# Patient Record
Sex: Male | Born: 1970 | Race: White | Hispanic: No | Marital: Married | Smoking: Former smoker
Health system: Southern US, Community
[De-identification: ages and names within clinical notes are randomized; demographics above are authoritative.]

## PROBLEM LIST (undated history)

## (undated) DIAGNOSIS — I10 Essential (primary) hypertension: Secondary | ICD-10-CM

## (undated) HISTORY — DX: Essential (primary) hypertension: I10

## (undated) HISTORY — PX: ELBOW ARTHROPLASTY: SHX928

---

## 2006-09-19 ENCOUNTER — Ambulatory Visit: Payer: Self-pay

## 2006-10-10 ENCOUNTER — Other Ambulatory Visit: Payer: Self-pay

## 2006-10-10 ENCOUNTER — Emergency Department: Payer: Self-pay | Admitting: Emergency Medicine

## 2006-10-17 ENCOUNTER — Ambulatory Visit: Payer: Self-pay | Admitting: Internal Medicine

## 2006-10-22 ENCOUNTER — Ambulatory Visit: Payer: Self-pay | Admitting: Anesthesiology

## 2006-10-27 ENCOUNTER — Ambulatory Visit: Payer: Self-pay | Admitting: Anesthesiology

## 2007-08-06 IMAGING — CT CT HEAD WITHOUT CONTRAST
2 series · 16 of 30 positions shown, 20 images · non-contrast
Comparison: none

REASON FOR EXAM: Occipital HA
COMMENTS:

[Series 2: without · axial · non-contrast · 0.44mm/px · z∈[-160,-35]mm · 13 of 31 slices shown, 17 images]
[im 3/31  brain]
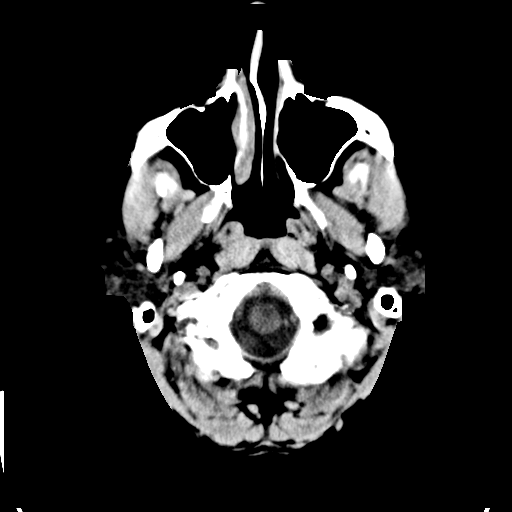
[im 3/31  bone]
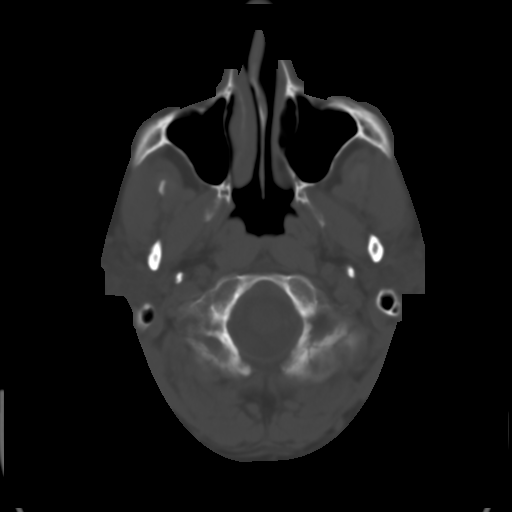
[im 5/31  brain]
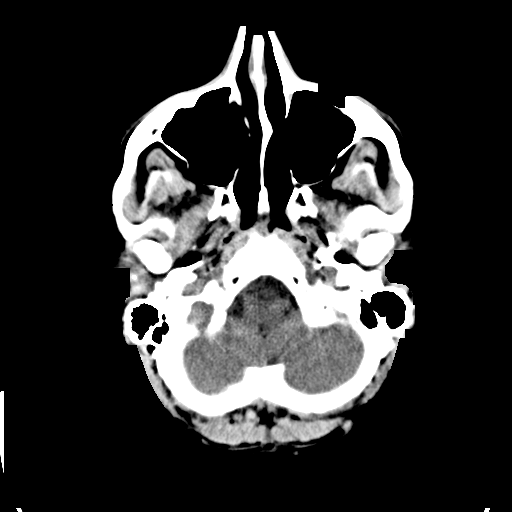
[im 7/31  brain]
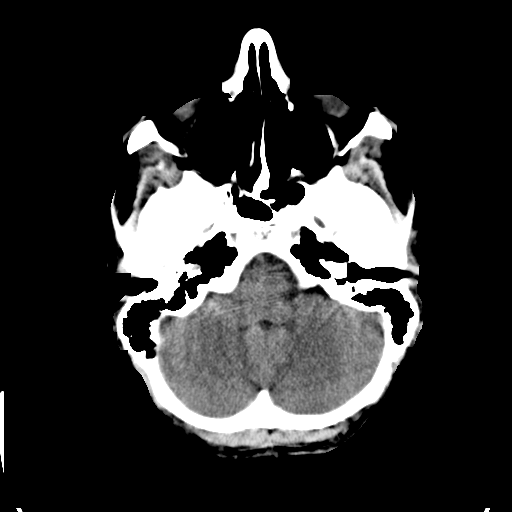
[im 9/31  brain]
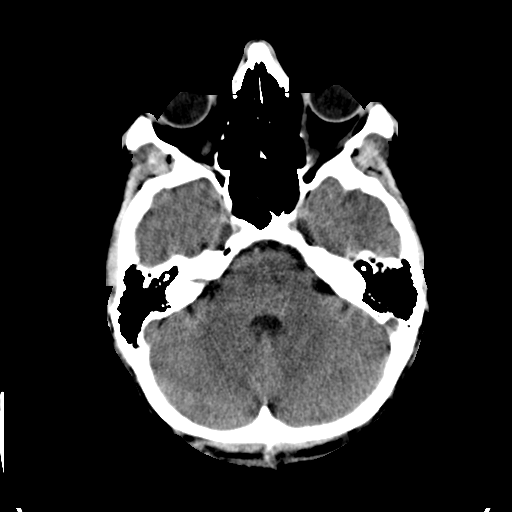
[im 11/31  brain]
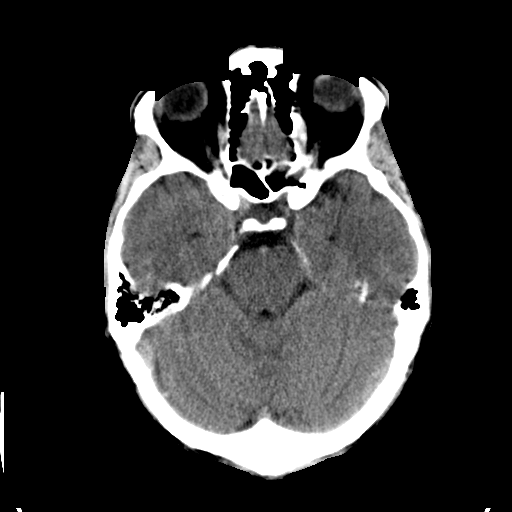
[im 11/31  bone]
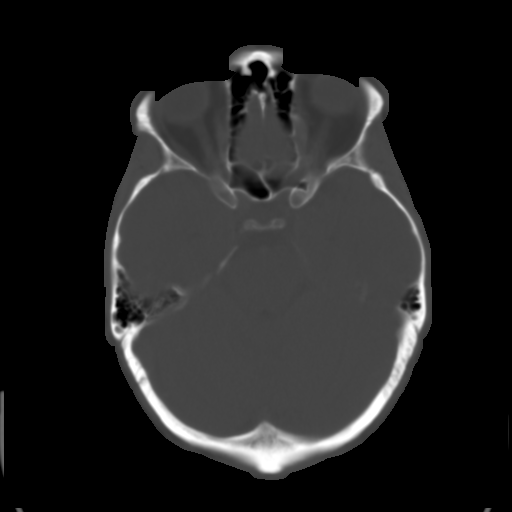
[im 13/31  brain]
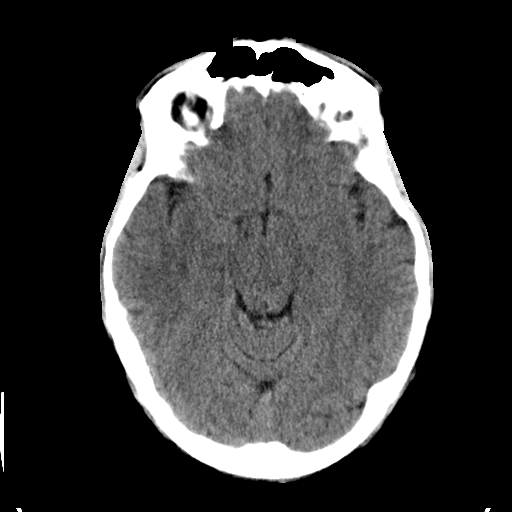
[im 16/31  brain]
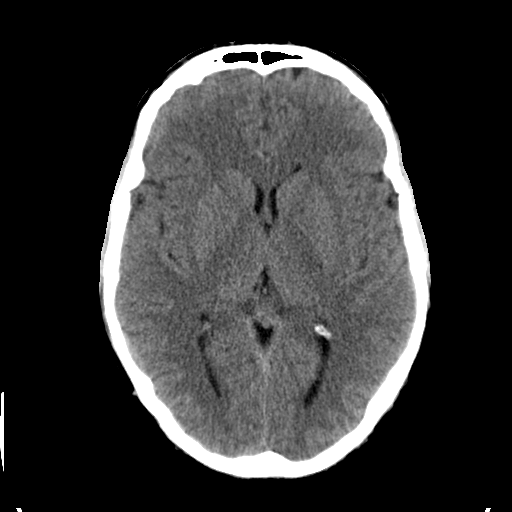
[im 18/31  brain]
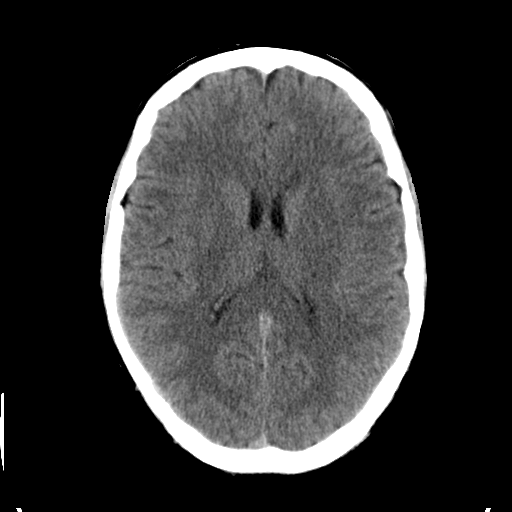
[im 20/31  brain]
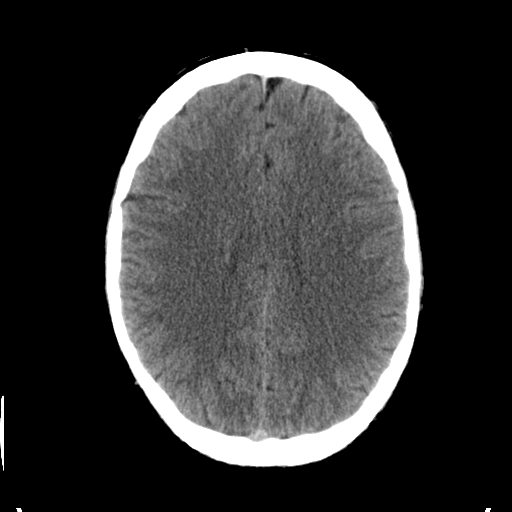
[im 20/31  bone]
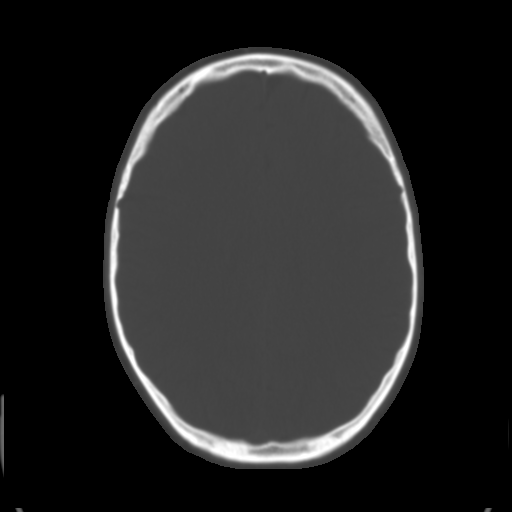
[im 22/31  brain]
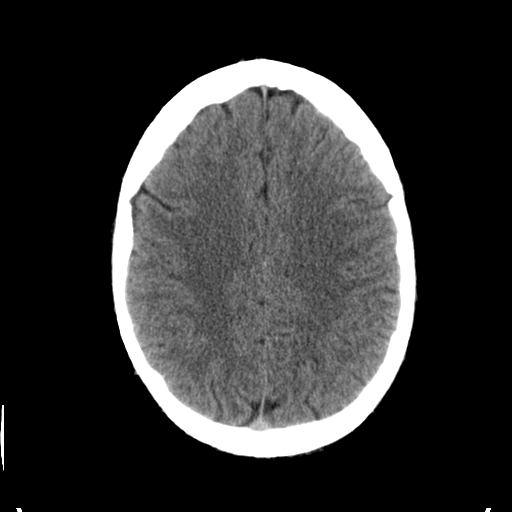
[im 24/31  brain]
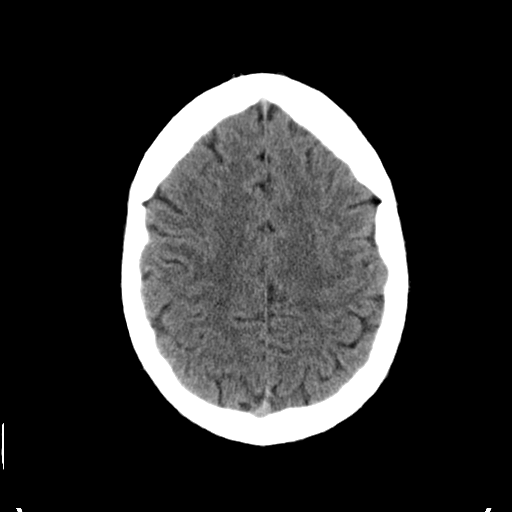
[im 26/31  brain]
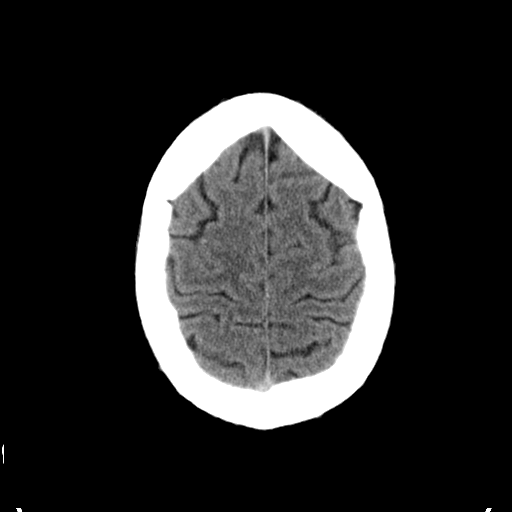
[im 28/31  brain]
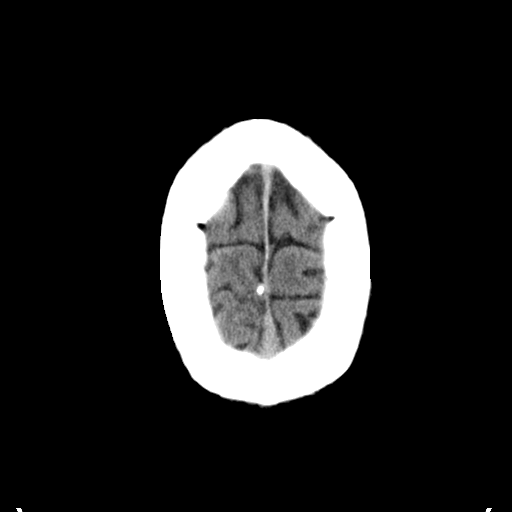
[im 28/31  bone]
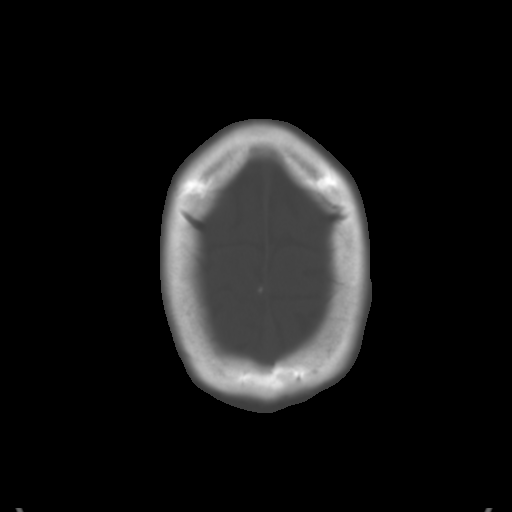

[Series 3: bone · axial · 0.44mm/px · z∈[-160,-120]mm · 3 of 31 slices shown]
[im 3/31  bone]
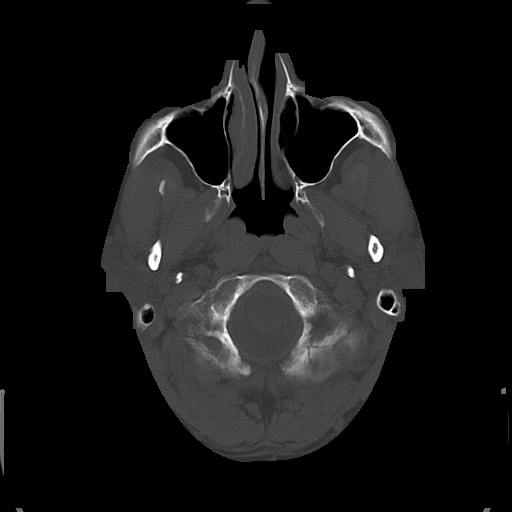
[im 7/31  bone]
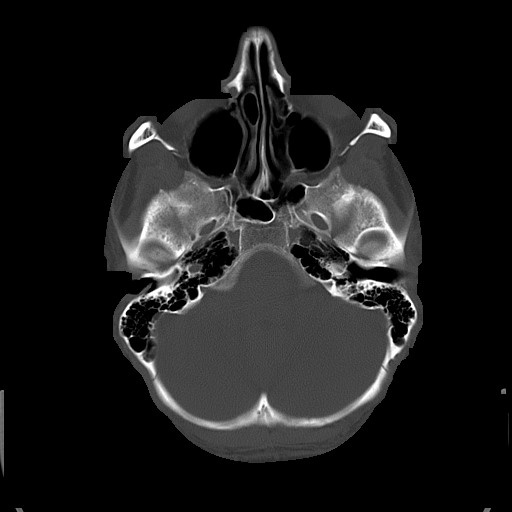
[im 11/31  bone]
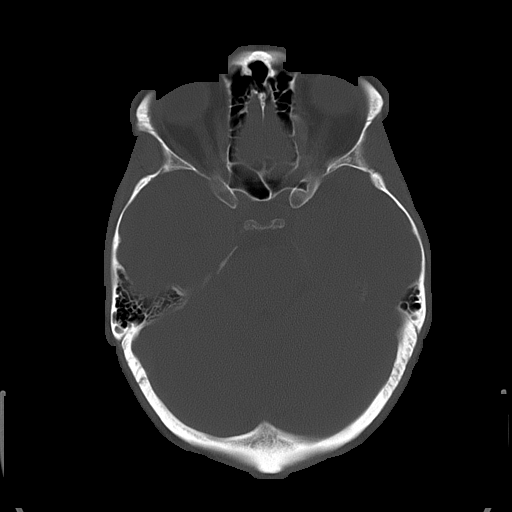

[16 of 30 positions shown; findings below may reference images not displayed]

PROCEDURE:     CT  - CT HEAD WITHOUT CONTRAST  - October 17, 2006  [DATE]

RESULT:     The patient has unexplained occipital headache.

The ventricles are normal in size and position. There is no intracranial
hemorrhage, mass, or mass-effect. At bone window settings the observed
portions of the paranasal sinuses are clear. I see no lytic nor blastic bony
lesions.
IMPRESSION: 1.I see no acute intracranial abnormality.

2.If the patient's symptoms persist, follow-up contrast-enhanced CT scanning
or MRI are available upon request.

## 2014-07-25 ENCOUNTER — Emergency Department: Payer: Self-pay | Admitting: Emergency Medicine

## 2015-09-18 DIAGNOSIS — G8929 Other chronic pain: Secondary | ICD-10-CM | POA: Diagnosis not present

## 2015-09-18 DIAGNOSIS — Z6828 Body mass index (BMI) 28.0-28.9, adult: Secondary | ICD-10-CM | POA: Diagnosis not present

## 2015-09-18 DIAGNOSIS — M549 Dorsalgia, unspecified: Secondary | ICD-10-CM | POA: Diagnosis not present

## 2015-09-18 DIAGNOSIS — E663 Overweight: Secondary | ICD-10-CM | POA: Diagnosis not present

## 2015-09-18 DIAGNOSIS — I10 Essential (primary) hypertension: Secondary | ICD-10-CM | POA: Diagnosis not present

## 2015-12-20 DIAGNOSIS — G8929 Other chronic pain: Secondary | ICD-10-CM | POA: Diagnosis not present

## 2015-12-20 DIAGNOSIS — Z136 Encounter for screening for cardiovascular disorders: Secondary | ICD-10-CM | POA: Diagnosis not present

## 2015-12-20 DIAGNOSIS — M549 Dorsalgia, unspecified: Secondary | ICD-10-CM | POA: Diagnosis not present

## 2015-12-20 DIAGNOSIS — I10 Essential (primary) hypertension: Secondary | ICD-10-CM | POA: Diagnosis not present

## 2016-02-07 DIAGNOSIS — H6123 Impacted cerumen, bilateral: Secondary | ICD-10-CM | POA: Diagnosis not present

## 2016-02-07 DIAGNOSIS — H93293 Other abnormal auditory perceptions, bilateral: Secondary | ICD-10-CM | POA: Diagnosis not present

## 2016-03-25 DIAGNOSIS — M549 Dorsalgia, unspecified: Secondary | ICD-10-CM | POA: Diagnosis not present

## 2016-03-25 DIAGNOSIS — Z23 Encounter for immunization: Secondary | ICD-10-CM | POA: Diagnosis not present

## 2016-03-25 DIAGNOSIS — E663 Overweight: Secondary | ICD-10-CM | POA: Diagnosis not present

## 2016-03-25 DIAGNOSIS — Z79891 Long term (current) use of opiate analgesic: Secondary | ICD-10-CM | POA: Diagnosis not present

## 2016-03-25 DIAGNOSIS — I10 Essential (primary) hypertension: Secondary | ICD-10-CM | POA: Diagnosis not present

## 2016-03-25 DIAGNOSIS — Z6829 Body mass index (BMI) 29.0-29.9, adult: Secondary | ICD-10-CM | POA: Diagnosis not present

## 2016-06-25 DIAGNOSIS — Z79891 Long term (current) use of opiate analgesic: Secondary | ICD-10-CM | POA: Diagnosis not present

## 2016-06-25 DIAGNOSIS — G43909 Migraine, unspecified, not intractable, without status migrainosus: Secondary | ICD-10-CM | POA: Diagnosis not present

## 2016-06-25 DIAGNOSIS — M549 Dorsalgia, unspecified: Secondary | ICD-10-CM | POA: Diagnosis not present

## 2016-06-25 DIAGNOSIS — I10 Essential (primary) hypertension: Secondary | ICD-10-CM | POA: Diagnosis not present

## 2016-06-25 DIAGNOSIS — E663 Overweight: Secondary | ICD-10-CM | POA: Diagnosis not present

## 2016-06-25 DIAGNOSIS — Z1389 Encounter for screening for other disorder: Secondary | ICD-10-CM | POA: Diagnosis not present

## 2016-08-15 DIAGNOSIS — H52223 Regular astigmatism, bilateral: Secondary | ICD-10-CM | POA: Diagnosis not present

## 2016-09-23 DIAGNOSIS — Z79899 Other long term (current) drug therapy: Secondary | ICD-10-CM | POA: Diagnosis not present

## 2016-09-23 DIAGNOSIS — M549 Dorsalgia, unspecified: Secondary | ICD-10-CM | POA: Diagnosis not present

## 2016-09-23 DIAGNOSIS — I10 Essential (primary) hypertension: Secondary | ICD-10-CM | POA: Diagnosis not present

## 2016-09-23 DIAGNOSIS — Z6830 Body mass index (BMI) 30.0-30.9, adult: Secondary | ICD-10-CM | POA: Diagnosis not present

## 2016-12-23 DIAGNOSIS — Z6829 Body mass index (BMI) 29.0-29.9, adult: Secondary | ICD-10-CM | POA: Diagnosis not present

## 2016-12-23 DIAGNOSIS — M549 Dorsalgia, unspecified: Secondary | ICD-10-CM | POA: Diagnosis not present

## 2016-12-23 DIAGNOSIS — E663 Overweight: Secondary | ICD-10-CM | POA: Diagnosis not present

## 2016-12-23 DIAGNOSIS — Z79899 Other long term (current) drug therapy: Secondary | ICD-10-CM | POA: Diagnosis not present

## 2016-12-23 DIAGNOSIS — I10 Essential (primary) hypertension: Secondary | ICD-10-CM | POA: Diagnosis not present

## 2017-03-05 DIAGNOSIS — R05 Cough: Secondary | ICD-10-CM | POA: Diagnosis not present

## 2017-03-25 DIAGNOSIS — Z6829 Body mass index (BMI) 29.0-29.9, adult: Secondary | ICD-10-CM | POA: Diagnosis not present

## 2017-03-25 DIAGNOSIS — M549 Dorsalgia, unspecified: Secondary | ICD-10-CM | POA: Diagnosis not present

## 2017-03-25 DIAGNOSIS — Z79899 Other long term (current) drug therapy: Secondary | ICD-10-CM | POA: Diagnosis not present

## 2017-03-25 DIAGNOSIS — I10 Essential (primary) hypertension: Secondary | ICD-10-CM | POA: Diagnosis not present

## 2017-06-26 DIAGNOSIS — I10 Essential (primary) hypertension: Secondary | ICD-10-CM | POA: Diagnosis not present

## 2017-06-26 DIAGNOSIS — Z6830 Body mass index (BMI) 30.0-30.9, adult: Secondary | ICD-10-CM | POA: Diagnosis not present

## 2017-06-26 DIAGNOSIS — M549 Dorsalgia, unspecified: Secondary | ICD-10-CM | POA: Diagnosis not present

## 2017-06-26 DIAGNOSIS — Z79899 Other long term (current) drug therapy: Secondary | ICD-10-CM | POA: Diagnosis not present

## 2017-09-03 DIAGNOSIS — R079 Chest pain, unspecified: Secondary | ICD-10-CM | POA: Diagnosis not present

## 2017-09-23 DIAGNOSIS — M549 Dorsalgia, unspecified: Secondary | ICD-10-CM | POA: Diagnosis not present

## 2017-09-23 DIAGNOSIS — I10 Essential (primary) hypertension: Secondary | ICD-10-CM | POA: Diagnosis not present

## 2017-09-23 DIAGNOSIS — Z79899 Other long term (current) drug therapy: Secondary | ICD-10-CM | POA: Diagnosis not present

## 2017-09-23 DIAGNOSIS — Z1331 Encounter for screening for depression: Secondary | ICD-10-CM | POA: Diagnosis not present

## 2017-12-23 DIAGNOSIS — Z79899 Other long term (current) drug therapy: Secondary | ICD-10-CM | POA: Diagnosis not present

## 2017-12-23 DIAGNOSIS — Z113 Encounter for screening for infections with a predominantly sexual mode of transmission: Secondary | ICD-10-CM | POA: Diagnosis not present

## 2017-12-23 DIAGNOSIS — M549 Dorsalgia, unspecified: Secondary | ICD-10-CM | POA: Diagnosis not present

## 2017-12-23 DIAGNOSIS — I10 Essential (primary) hypertension: Secondary | ICD-10-CM | POA: Diagnosis not present

## 2017-12-23 DIAGNOSIS — Z6830 Body mass index (BMI) 30.0-30.9, adult: Secondary | ICD-10-CM | POA: Diagnosis not present

## 2018-03-27 DIAGNOSIS — I10 Essential (primary) hypertension: Secondary | ICD-10-CM | POA: Diagnosis not present

## 2018-03-27 DIAGNOSIS — Z6830 Body mass index (BMI) 30.0-30.9, adult: Secondary | ICD-10-CM | POA: Diagnosis not present

## 2018-03-27 DIAGNOSIS — M549 Dorsalgia, unspecified: Secondary | ICD-10-CM | POA: Diagnosis not present

## 2018-03-27 DIAGNOSIS — Z79899 Other long term (current) drug therapy: Secondary | ICD-10-CM | POA: Diagnosis not present

## 2018-06-26 DIAGNOSIS — Z6830 Body mass index (BMI) 30.0-30.9, adult: Secondary | ICD-10-CM | POA: Diagnosis not present

## 2018-06-26 DIAGNOSIS — I10 Essential (primary) hypertension: Secondary | ICD-10-CM | POA: Diagnosis not present

## 2018-06-26 DIAGNOSIS — M549 Dorsalgia, unspecified: Secondary | ICD-10-CM | POA: Diagnosis not present

## 2018-06-26 DIAGNOSIS — R609 Edema, unspecified: Secondary | ICD-10-CM | POA: Diagnosis not present

## 2018-06-26 DIAGNOSIS — Z79899 Other long term (current) drug therapy: Secondary | ICD-10-CM | POA: Diagnosis not present

## 2018-09-24 DIAGNOSIS — Z6831 Body mass index (BMI) 31.0-31.9, adult: Secondary | ICD-10-CM | POA: Diagnosis not present

## 2018-09-24 DIAGNOSIS — I10 Essential (primary) hypertension: Secondary | ICD-10-CM | POA: Diagnosis not present

## 2018-09-24 DIAGNOSIS — M549 Dorsalgia, unspecified: Secondary | ICD-10-CM | POA: Diagnosis not present

## 2018-09-24 DIAGNOSIS — Z79899 Other long term (current) drug therapy: Secondary | ICD-10-CM | POA: Diagnosis not present

## 2019-01-22 DIAGNOSIS — Z6829 Body mass index (BMI) 29.0-29.9, adult: Secondary | ICD-10-CM | POA: Diagnosis not present

## 2019-01-22 DIAGNOSIS — M549 Dorsalgia, unspecified: Secondary | ICD-10-CM | POA: Diagnosis not present

## 2019-01-22 DIAGNOSIS — G8929 Other chronic pain: Secondary | ICD-10-CM | POA: Diagnosis not present

## 2019-01-22 DIAGNOSIS — Z79899 Other long term (current) drug therapy: Secondary | ICD-10-CM | POA: Diagnosis not present

## 2019-01-22 DIAGNOSIS — Z1331 Encounter for screening for depression: Secondary | ICD-10-CM | POA: Diagnosis not present

## 2019-01-22 DIAGNOSIS — I10 Essential (primary) hypertension: Secondary | ICD-10-CM | POA: Diagnosis not present

## 2019-03-10 ENCOUNTER — Other Ambulatory Visit: Payer: Self-pay

## 2019-03-10 DIAGNOSIS — Z20822 Contact with and (suspected) exposure to covid-19: Secondary | ICD-10-CM

## 2019-03-10 DIAGNOSIS — Z20828 Contact with and (suspected) exposure to other viral communicable diseases: Secondary | ICD-10-CM | POA: Diagnosis not present

## 2019-03-11 LAB — NOVEL CORONAVIRUS, NAA: SARS-CoV-2, NAA: DETECTED — AB

## 2019-05-03 DIAGNOSIS — Z79899 Other long term (current) drug therapy: Secondary | ICD-10-CM | POA: Diagnosis not present

## 2019-05-03 DIAGNOSIS — G8929 Other chronic pain: Secondary | ICD-10-CM | POA: Diagnosis not present

## 2019-05-03 DIAGNOSIS — M549 Dorsalgia, unspecified: Secondary | ICD-10-CM | POA: Diagnosis not present

## 2019-05-03 DIAGNOSIS — I10 Essential (primary) hypertension: Secondary | ICD-10-CM | POA: Diagnosis not present

## 2019-05-03 DIAGNOSIS — Z6829 Body mass index (BMI) 29.0-29.9, adult: Secondary | ICD-10-CM | POA: Diagnosis not present

## 2019-07-26 DIAGNOSIS — I10 Essential (primary) hypertension: Secondary | ICD-10-CM | POA: Diagnosis not present

## 2019-07-26 DIAGNOSIS — M549 Dorsalgia, unspecified: Secondary | ICD-10-CM | POA: Diagnosis not present

## 2019-07-26 DIAGNOSIS — G8929 Other chronic pain: Secondary | ICD-10-CM | POA: Diagnosis not present

## 2019-07-26 DIAGNOSIS — Z79899 Other long term (current) drug therapy: Secondary | ICD-10-CM | POA: Diagnosis not present

## 2019-07-26 DIAGNOSIS — N529 Male erectile dysfunction, unspecified: Secondary | ICD-10-CM | POA: Diagnosis not present

## 2021-02-10 LAB — COLOGUARD: Cologuard: NEGATIVE

## 2021-12-14 ENCOUNTER — Encounter: Payer: Self-pay | Admitting: Nurse Practitioner

## 2021-12-14 ENCOUNTER — Other Ambulatory Visit: Payer: Self-pay

## 2021-12-14 ENCOUNTER — Ambulatory Visit (INDEPENDENT_AMBULATORY_CARE_PROVIDER_SITE_OTHER): Payer: BC Managed Care – PPO | Admitting: Nurse Practitioner

## 2021-12-14 VITALS — BP 124/80 | HR 76 | Temp 98.3°F | Resp 18 | Ht 68.0 in | Wt 208.9 lb

## 2021-12-14 DIAGNOSIS — Z13220 Encounter for screening for lipoid disorders: Secondary | ICD-10-CM | POA: Diagnosis not present

## 2021-12-14 DIAGNOSIS — G43009 Migraine without aura, not intractable, without status migrainosus: Secondary | ICD-10-CM

## 2021-12-14 DIAGNOSIS — Z131 Encounter for screening for diabetes mellitus: Secondary | ICD-10-CM

## 2021-12-14 DIAGNOSIS — Z7689 Persons encountering health services in other specified circumstances: Secondary | ICD-10-CM

## 2021-12-14 DIAGNOSIS — I10 Essential (primary) hypertension: Secondary | ICD-10-CM | POA: Diagnosis not present

## 2021-12-14 DIAGNOSIS — Z130 Encounter for screening for diseases of the blood and blood-forming organs and certain disorders involving the immune mechanism: Secondary | ICD-10-CM

## 2021-12-14 DIAGNOSIS — Z1159 Encounter for screening for other viral diseases: Secondary | ICD-10-CM

## 2021-12-14 DIAGNOSIS — Z114 Encounter for screening for human immunodeficiency virus [HIV]: Secondary | ICD-10-CM

## 2021-12-14 NOTE — Progress Notes (Signed)
BP 124/80   Pulse 76   Temp 98.3 F (36.8 C) (Oral)   Resp 18   Ht 5\' 8"  (1.727 m)   Wt 208 lb 14.4 oz (94.8 kg)   SpO2 97%   BMI 31.76 kg/m    Subjective:    Patient ID: Timothy Klein, male    DOB: [birth year], 51 y.o.   MRN: [id number]  HPI: Timothy Klein is a 51 y.o. male  Chief Complaint  Patient presents with   Establish Care   Hypertension   Establish care: Last physical was about six months ago.  His last cologuard was recent, will get records.  Only medical history is hypertension and migraines.  Hypertension: Blood pressure today is 124/80.  He denies any chest pain, shortness of breath, headaches or blurred vision.  He is currently taking lisinopril 40 mg daily and amlodipine 10 mg daily.  Patient reports he has been doing well on this.  Migraine: She reports that every once in a while he gets a migraine he takes an 800 mg Motrin and that takes care of it.  Patient states it does not happen very often.  Relevant past medical, surgical, family and social history reviewed and updated as indicated. Interim medical history since our last visit reviewed. Allergies and medications reviewed and updated.  Review of Systems  Constitutional: Negative for fever or weight change.  Respiratory: Negative for cough and shortness of breath.   Cardiovascular: Negative for chest pain or palpitations.  Gastrointestinal: Negative for abdominal pain, no bowel changes.  Musculoskeletal: Negative for gait problem or joint swelling.  Skin: Negative for rash.  Neurological: Negative for dizziness or headache.  No other specific complaints in a complete review of systems (except as listed in HPI above).      Objective:    BP 124/80   Pulse 76   Temp 98.3 F (36.8 C) (Oral)   Resp 18   Ht 5\' 8"  (1.727 m)   Wt 208 lb 14.4 oz (94.8 kg)   SpO2 97%   BMI 31.76 kg/m   Wt Readings from Last 3 Encounters:  12/14/21 208 lb 14.4 oz (94.8 kg)    Physical Exam  Constitutional:  Patient appears well-developed and well-nourished. Obese  No distress.  HEENT: head atraumatic, normocephalic, pupils equal and reactive to light,  neck supple Cardiovascular: Normal rate, regular rhythm and normal heart sounds.  No murmur heard. No BLE edema. Pulmonary/Chest: Effort normal and breath sounds normal. No respiratory distress. Abdominal: Soft.  There is no tenderness. Psychiatric: Patient has a normal mood and affect. behavior is normal. Judgment and thought content normal.   Results for orders placed or performed in visit on 03/10/19  Novel Coronavirus, NAA ([facility])   Specimen: Oropharyngeal(OP) collection in vial transport medium   OROPHARYNGEA  TESTING  Result Value Ref Range   SARS-CoV-2, NAA Detected (A) Not Detected      Assessment & Plan:   Problem List Items Addressed This Visit       Cardiovascular and Mediastinum   Essential hypertension - Primary    Patient blood pressure at goal 124/80.  He is currently taking lisinopril 40 mg daily and amlodipine 10 mg daily he can continue with this.      Relevant Medications   amLODipine (NORVASC) 10 MG tablet   lisinopril (ZESTRIL) 40 MG tablet   Other Relevant Orders   CBC with Differential/Platelet   COMPLETE METABOLIC PANEL WITH GFR   Migraine without aura and without status migrainosus,  not intractable    She reports when he does have a migraine he takes an 800 mg Motrin and that takes care of the pain.  He says it does not happen very often.  Continue with current treatment      Relevant Medications   amLODipine (NORVASC) 10 MG tablet   lisinopril (ZESTRIL) 40 MG tablet   ibuprofen (ADVIL) 800 MG tablet   Other Visit Diagnoses     Encounter to establish care       Get records from his last Cologuard.   Relevant Orders   CBC with Differential/Platelet   COMPLETE METABOLIC PANEL WITH GFR   Lipid panel   Hemoglobin A1c   Hepatitis C antibody   HIV Antibody (routine testing w rflx)   Screening for  diabetes mellitus       Relevant Orders   Hemoglobin A1c   Screening for cholesterol level       Relevant Orders   Lipid panel   Encounter for hepatitis C screening test for low risk patient       Relevant Orders   Hepatitis C antibody   Screening for HIV without presence of risk factors       Relevant Orders   HIV Antibody (routine testing w rflx)   Screening for deficiency anemia       Relevant Orders   CBC with Differential/Platelet        Follow up plan: Return in about 6 months (around 06/16/2022) for follow up.

## 2021-12-14 NOTE — Assessment & Plan Note (Signed)
Patient blood pressure at goal 124/80.  He is currently taking lisinopril 40 mg daily and amlodipine 10 mg daily he can continue with this.

## 2021-12-14 NOTE — Assessment & Plan Note (Signed)
She reports when he does have a migraine he takes an 800 mg Motrin and that takes care of the pain.  He says it does not happen very often.  Continue with current treatment

## 2021-12-17 LAB — CBC WITH DIFFERENTIAL/PLATELET
Absolute Monocytes: 561 cells/uL (ref 200–950)
Basophils Absolute: 92 cells/uL (ref 0–200)
Basophils Relative: 1.3 %
Eosinophils Absolute: 78 cells/uL (ref 15–500)
Eosinophils Relative: 1.1 %
HCT: 43.7 % (ref 38.5–50.0)
Hemoglobin: 13.5 g/dL (ref 13.2–17.1)
Lymphs Abs: 1683 cells/uL (ref 850–3900)
MCH: 20 pg — ABNORMAL LOW (ref 27.0–33.0)
MCHC: 30.9 g/dL — ABNORMAL LOW (ref 32.0–36.0)
MCV: 64.8 fL — ABNORMAL LOW (ref 80.0–100.0)
Monocytes Relative: 7.9 %
Neutro Abs: 4686 cells/uL (ref 1500–7800)
Neutrophils Relative %: 66 %
Platelets: 228 10*3/uL (ref 140–400)
RBC: 6.74 10*6/uL — ABNORMAL HIGH (ref 4.20–5.80)
RDW: 17.9 % — ABNORMAL HIGH (ref 11.0–15.0)
Total Lymphocyte: 23.7 %
WBC: 7.1 10*3/uL (ref 3.8–10.8)

## 2021-12-17 LAB — LIPID PANEL
Cholesterol: 167 mg/dL (ref <200)
HDL: 39 mg/dL — ABNORMAL LOW (ref >=40)
LDL Cholesterol (Calc): 105 mg/dL (calc) — ABNORMAL HIGH
Non-HDL Cholesterol (Calc): 128 mg/dL (calc) (ref <130)
Total CHOL/HDL Ratio: 4.3 (calc) (ref <5.0)
Triglycerides: 129 mg/dL (ref <150)

## 2021-12-17 LAB — COMPLETE METABOLIC PANEL WITH GFR
AG Ratio: 2 (calc) (ref 1.0–2.5)
ALT: 22 U/L (ref 9–46)
AST: 18 U/L (ref 10–35)
Albumin: 4.9 g/dL (ref 3.6–5.1)
Alkaline phosphatase (APISO): 73 U/L (ref 35–144)
BUN: 17 mg/dL (ref 7–25)
CO2: 23 mmol/L (ref 20–32)
Calcium: 10.1 mg/dL (ref 8.6–10.3)
Chloride: 103 mmol/L (ref 98–110)
Creat: 1.06 mg/dL (ref 0.70–1.30)
Globulin: 2.4 g/dL (calc) (ref 1.9–3.7)
Glucose, Bld: 86 mg/dL (ref 65–99)
Potassium: 4.1 mmol/L (ref 3.5–5.3)
Sodium: 138 mmol/L (ref 135–146)
Total Bilirubin: 0.7 mg/dL (ref 0.2–1.2)
Total Protein: 7.3 g/dL (ref 6.1–8.1)
eGFR: 85 mL/min/{1.73_m2} (ref >=60)

## 2021-12-17 LAB — HEMOGLOBIN A1C
Hgb A1c MFr Bld: 5.5 % of total Hgb (ref <5.7)
Mean Plasma Glucose: 111 mg/dL
eAG (mmol/L): 6.2 mmol/L

## 2021-12-17 LAB — HEPATITIS C ANTIBODY: Hepatitis C Ab: NONREACTIVE

## 2021-12-17 LAB — HIV ANTIBODY (ROUTINE TESTING W REFLEX): HIV 1&2 Ab, 4th Generation: NONREACTIVE

## 2022-05-21 ENCOUNTER — Other Ambulatory Visit: Payer: Self-pay | Admitting: Nurse Practitioner

## 2022-05-21 NOTE — Telephone Encounter (Signed)
Medication Refill - Medication: amLODipine (NORVASC) 10 MG tablet   Has the patient contacted their pharmacy? Yes.   Pt told to contact provider  Preferred Pharmacy (with phone number or street name):  [id number] [place], [place] - [id number] Phone: [phone]  Fax: [phone]     Has the patient been seen for an appointment in the last year OR does the patient have an upcoming appointment? Yes.    Agent: Please be advised that RX refills may take up to 3 business days. We ask that you follow-up with your pharmacy.

## 2022-05-23 NOTE — Telephone Encounter (Signed)
Requested medication (s) are due for refill today: Amount not specified  Requested medication (s) are on the active medication list: yes    Last refill: 12/14/21  Amount not specified  Future visit scheduled yes 06/13/22  Notes to clinic:Historical provider, please review. Thank you.  Requested Prescriptions  Pending Prescriptions Disp Refills   amLODipine (NORVASC) 10 MG tablet      Sig: Take 1 tablet (10 mg total) by mouth daily.     Cardiovascular: Calcium Channel Blockers 2 Passed - 05/21/2022  3:40 PM      Passed - Last BP in normal range    BP Readings from Last 1 Encounters:  12/14/21 124/80         Passed - Last Heart Rate in normal range    Pulse Readings from Last 1 Encounters:  12/14/21 76         Passed - Valid encounter within last 6 months    Recent Outpatient Visits           5 months ago Essential hypertension   [facility] Berniece Salines, FNP       Future Appointments             In 3 weeks Zane Herald, Rudolpho Sevin, FNP [facility], [facility]

## 2022-05-31 MED ORDER — AMLODIPINE BESYLATE 10 MG PO TABS: 10.0000 mg | ORAL_TABLET | Freq: Every day | ORAL | 1 refills | Status: DC

## 2022-06-13 ENCOUNTER — Other Ambulatory Visit: Payer: Self-pay

## 2022-06-13 ENCOUNTER — Ambulatory Visit (INDEPENDENT_AMBULATORY_CARE_PROVIDER_SITE_OTHER): Payer: BC Managed Care – PPO | Admitting: Nurse Practitioner

## 2022-06-13 ENCOUNTER — Encounter: Payer: Self-pay | Admitting: Nurse Practitioner

## 2022-06-13 VITALS — BP 132/74 | HR 78 | Temp 98.7°F | Resp 16 | Ht 68.0 in | Wt 191.4 lb

## 2022-06-13 DIAGNOSIS — S025XXA Fracture of tooth (traumatic), initial encounter for closed fracture: Secondary | ICD-10-CM

## 2022-06-13 DIAGNOSIS — E782 Mixed hyperlipidemia: Secondary | ICD-10-CM | POA: Diagnosis not present

## 2022-06-13 DIAGNOSIS — G43009 Migraine without aura, not intractable, without status migrainosus: Secondary | ICD-10-CM | POA: Diagnosis not present

## 2022-06-13 DIAGNOSIS — I10 Essential (primary) hypertension: Secondary | ICD-10-CM | POA: Diagnosis not present

## 2022-06-13 DIAGNOSIS — L989 Disorder of the skin and subcutaneous tissue, unspecified: Secondary | ICD-10-CM

## 2022-06-13 DIAGNOSIS — R600 Localized edema: Secondary | ICD-10-CM

## 2022-06-13 DIAGNOSIS — Z131 Encounter for screening for diabetes mellitus: Secondary | ICD-10-CM | POA: Diagnosis not present

## 2022-06-13 MED ORDER — FUROSEMIDE 20 MG PO TABS: 20.0000 mg | ORAL_TABLET | Freq: Every day | ORAL | 0 refills | Status: DC | PRN

## 2022-06-13 MED ORDER — IBUPROFEN 800 MG PO TABS: 800.0000 mg | ORAL_TABLET | Freq: Three times a day (TID) | ORAL | 1 refills | Status: DC | PRN

## 2022-06-13 MED ORDER — LISINOPRIL 40 MG PO TABS: 40.0000 mg | ORAL_TABLET | Freq: Every day | ORAL | 1 refills | Status: DC

## 2022-06-13 MED ORDER — AMLODIPINE BESYLATE 10 MG PO TABS: 10.0000 mg | ORAL_TABLET | Freq: Every day | ORAL | 1 refills | Status: DC

## 2022-06-13 NOTE — Progress Notes (Signed)
BP 132/74   Pulse 78   Temp 98.7 F (37.1 C) (Oral)   Resp 16   Ht 5\' 8"  (1.727 m)   Wt 191 lb 6.4 oz (86.8 kg)   SpO2 98%   BMI 29.10 kg/m    Subjective:    Patient ID: Timothy Klein, male    DOB: 1971/03/31, 52 y.o.   MRN: [id number]  HPI: Timothy Klein is a 52 y.o. male  Chief Complaint  Patient presents with   Hypertension    6 month follow up    Hypertension: Blood pressure today is 132/74.  He denies any blurred vision, shortness of breath or chest pain.  He currently takes amlodipine 10 mg daily and lisinopril 40 mg daily. He says he has been exercising a lot lately.    Migraine: Patient reports he gets a migraine every once in the a while.  When that happens he takes Motrin 800 mg which helps.   Hyperlipidemia: His last LDL was 105 on 12/14/2021.  Discussed decreasing saturated fats in diet. The 10-year ASCVD risk score (Arnett DK, et al., 2019) is: 4.8%   Values used to calculate the score:     Age: 21 years     Sex: Male     Is Non-Hispanic African American: No     Diabetic: No     Tobacco smoker: No     Systolic Blood Pressure: 062 mmHg     Is BP treated: Yes     HDL Cholesterol: 39 mg/dL     Total Cholesterol: 167 mg/dL   Facial lesion: patient has a lesion on the right side of his face near his eye. He says that it has been there for about a year and it has gotten bigger. Will refer to dermatology.  Lower extremity edema:  he says that when he travels he gets leg swelling. He is getting ready to go on a cruise and wants to refill his lasix that he takes when he travels. Refill sent.   Broken teeth:  he says he has several broken teeth in the back would like to get them cut out.  He says they have been broken for a long time.  Would like to see oral surgeon and not dentist. Referral placed.   Relevant past medical, surgical, family and social history reviewed and updated as indicated. Interim medical history since our last visit reviewed. Allergies and  medications reviewed and updated.  Review of Systems  Constitutional: Negative for fever or weight change.  Respiratory: Negative for cough and shortness of breath.   Cardiovascular: Negative for chest pain or palpitations.  Gastrointestinal: Negative for abdominal pain, no bowel changes.  Musculoskeletal: Negative for gait problem or joint swelling.  Skin: Negative for rash.  Neurological: Negative for dizziness or headache.  No other specific complaints in a complete review of systems (except as listed in HPI above).      Objective:    BP 132/74   Pulse 78   Temp 98.7 F (37.1 C) (Oral)   Resp 16   Ht 5\' 8"  (1.727 m)   Wt 191 lb 6.4 oz (86.8 kg)   SpO2 98%   BMI 29.10 kg/m   Wt Readings from Last 3 Encounters:  06/13/22 191 lb 6.4 oz (86.8 kg)  12/14/21 208 lb 14.4 oz (94.8 kg)    Physical Exam  Constitutional: Patient appears well-developed and well-nourished. Obese  No distress.  HEENT: head atraumatic, normocephalic, pupils equal and reactive to light,  neck supple Cardiovascular: Normal rate, regular rhythm and normal heart sounds.  No murmur heard. No BLE edema. Pulmonary/Chest: Effort normal and breath sounds normal. No respiratory distress. Abdominal: Soft.  There is no tenderness. Psychiatric: Patient has a normal mood and affect. behavior is normal. Judgment and thought content normal.   Results for orders placed or performed in visit on 12/14/21  CBC with Differential/Platelet  Result Value Ref Range   WBC 7.1 3.8 - 10.8 Thousand/uL   RBC 6.74 (H) 4.20 - 5.80 Million/uL   Hemoglobin 13.5 13.2 - 17.1 g/dL   HCT 16.1 09.6 - 04.5 %   MCV 64.8 (L) 80.0 - 100.0 fL   MCH 20.0 (L) 27.0 - 33.0 pg   MCHC 30.9 (L) 32.0 - 36.0 g/dL   RDW 40.9 (H) 81.1 - 91.4 %   Platelets 228 140 - 400 Thousand/uL   MPV  7.5 - 12.5 fL   Neutro Abs 4,686 1,500 - 7,800 cells/uL   Lymphs Abs 1,683 850 - 3,900 cells/uL   Absolute Monocytes 561 200 - 950 cells/uL   Eosinophils  Absolute 78 15 - 500 cells/uL   Basophils Absolute 92 0 - 200 cells/uL   Neutrophils Relative % 66 %   Total Lymphocyte 23.7 %   Monocytes Relative 7.9 %   Eosinophils Relative 1.1 %   Basophils Relative 1.3 %  COMPLETE METABOLIC PANEL WITH GFR  Result Value Ref Range   Glucose, Bld 86 65 - 99 mg/dL   BUN 17 7 - 25 mg/dL   Creat 7.82 9.56 - 2.13 mg/dL   eGFR 85 > OR = 60 YQ/MVH/[id number]   BUN/Creatinine Ratio NOT APPLICABLE 6 - 22 (calc)   Sodium 138 135 - 146 mmol/L   Potassium 4.1 3.5 - 5.3 mmol/L   Chloride 103 98 - 110 mmol/L   CO2 23 20 - 32 mmol/L   Calcium 10.1 8.6 - 10.3 mg/dL   Total Protein 7.3 6.1 - 8.1 g/dL   Albumin 4.9 3.6 - 5.1 g/dL   Globulin 2.4 1.9 - 3.7 g/dL (calc)   AG Ratio 2.0 1.0 - 2.5 (calc)   Total Bilirubin 0.7 0.2 - 1.2 mg/dL   Alkaline phosphatase (APISO) 73 35 - 144 U/L   AST 18 10 - 35 U/L   ALT 22 9 - 46 U/L  Lipid panel  Result Value Ref Range   Cholesterol 167 <200 mg/dL   HDL 39 (L) > OR = 40 mg/dL   Triglycerides 295 <284 mg/dL   LDL Cholesterol (Calc) 105 (H) mg/dL (calc)   Total CHOL/HDL Ratio 4.3 <5.0 (calc)   Non-HDL Cholesterol (Calc) 128 <130 mg/dL (calc)  Hemoglobin X3K  Result Value Ref Range   Hgb A1c MFr Bld 5.5 <5.7 % of total Hgb   Mean Plasma Glucose 111 mg/dL   eAG (mmol/L) 6.2 mmol/L  Hepatitis C antibody  Result Value Ref Range   Hepatitis C Ab NON-REACTIVE NON-REACTIVE  HIV Antibody (routine testing w rflx)  Result Value Ref Range   HIV 1&2 Ab, 4th Generation NON-REACTIVE NON-REACTIVE      Assessment & Plan:   Problem List Items Addressed This Visit       Cardiovascular and Mediastinum   Essential hypertension - Primary    Continue taking lisinopril 40 mg daily and amlodipine 10 mg daily.       Relevant Medications   lisinopril (ZESTRIL) 40 MG tablet   amLODipine (NORVASC) 10 MG tablet   furosemide (LASIX) 20 MG tablet  Other Relevant Orders   CBC with Differential/Platelet   COMPLETE METABOLIC PANEL  WITH GFR   Migraine without aura and without status migrainosus, not intractable    Continue to use ibuprofen for migraine when needed      Relevant Medications   lisinopril (ZESTRIL) 40 MG tablet   amLODipine (NORVASC) 10 MG tablet   ibuprofen (ADVIL) 800 MG tablet   furosemide (LASIX) 20 MG tablet     Other   Mixed hyperlipidemia    Continue to work on life style modification      Relevant Medications   lisinopril (ZESTRIL) 40 MG tablet   amLODipine (NORVASC) 10 MG tablet   furosemide (LASIX) 20 MG tablet   Other Relevant Orders   Lipid panel   Other Visit Diagnoses     Screening for diabetes mellitus       Relevant Orders   COMPLETE METABOLIC PANEL WITH GFR   Hemoglobin A1c   Lower extremity edema       refill of lasix sent in, watch sodium intake   Relevant Medications   furosemide (LASIX) 20 MG tablet   Skin lesion       referral placed to dermatology   Relevant Orders   Ambulatory referral to Dermatology   Broken teeth       referral placed to oral surgeon   Relevant Orders   Ambulatory referral to Oral Maxillofacial Surgery        Follow up plan: Return in about 6 months (around 12/12/2022) for cpe.  He request to get labs at next visit.

## 2022-06-13 NOTE — Assessment & Plan Note (Signed)
Continue to work on lifestyle modification. 

## 2022-06-13 NOTE — Assessment & Plan Note (Signed)
Continue to use ibuprofen for migraine when needed

## 2022-06-13 NOTE — Assessment & Plan Note (Signed)
Continue taking lisinopril 40 mg daily and amlodipine 10 mg daily.

## 2022-07-09 ENCOUNTER — Other Ambulatory Visit: Payer: Self-pay | Admitting: Nurse Practitioner

## 2022-07-09 DIAGNOSIS — R600 Localized edema: Secondary | ICD-10-CM

## 2022-07-10 NOTE — Telephone Encounter (Signed)
Requested medication (s) are due for refill today: Duue 07/14/22  Requested medication (s) are on the active medication list: yes    Last refill: 06/13/22  #30  0 refills  Future visit scheduled yes 12/12/22  Notes to clinic Failed due to labs, please review. Thank you.  Requested Prescriptions  Pending Prescriptions Disp Refills   furosemide (LASIX) 20 MG tablet [Pharmacy Med Name: FUROSEMIDE 20 MG TABLET] 30 tablet 0    Sig: TAKE 1 TABLET BY MOUTH DAILY AS NEEDED FOR EDEMA     Cardiovascular:  Diuretics - Loop Failed - 07/09/2022  6:23 AM      Failed - K in normal range and within 180 days    Potassium  Date Value Ref Range Status  12/14/2021 4.1 3.5 - 5.3 mmol/L Final         Failed - Ca in normal range and within 180 days    Calcium  Date Value Ref Range Status  12/14/2021 10.1 8.6 - 10.3 mg/dL Final         Failed - Na in normal range and within 180 days    Sodium  Date Value Ref Range Status  12/14/2021 138 135 - 146 mmol/L Final         Failed - Cr in normal range and within 180 days    Creat  Date Value Ref Range Status  12/14/2021 1.06 0.70 - 1.30 mg/dL Final         Failed - Cl in normal range and within 180 days    Chloride  Date Value Ref Range Status  12/14/2021 103 98 - 110 mmol/L Final         Failed - Mg Level in normal range and within 180 days    No results found for: "MG"       Passed - Last BP in normal range    BP Readings from Last 1 Encounters:  06/13/22 132/74         Passed - Valid encounter within last 6 months    Recent Outpatient Visits           3 weeks ago Essential hypertension   [place], Julie F, FNP   6 months ago Essential hypertension   [facility] Bo Merino, FNP       Future Appointments             In 5 months Reece Packer, Myna Hidalgo, [facility], [facility]

## 2022-07-15 ENCOUNTER — Other Ambulatory Visit: Payer: Self-pay | Admitting: Emergency Medicine

## 2022-07-15 DIAGNOSIS — R600 Localized edema: Secondary | ICD-10-CM

## 2022-07-15 MED ORDER — FUROSEMIDE 20 MG PO TABS: 20.0000 mg | ORAL_TABLET | Freq: Every day | ORAL | 0 refills | Status: DC | PRN

## 2022-08-08 ENCOUNTER — Other Ambulatory Visit: Payer: Self-pay | Admitting: Nurse Practitioner

## 2022-08-08 DIAGNOSIS — G43009 Migraine without aura, not intractable, without status migrainosus: Secondary | ICD-10-CM

## 2022-08-09 NOTE — Telephone Encounter (Signed)
Unable to refill per protocol, Rx request is too soon. Last refill 06/13/22 for 90 days and 1 refill.  Requested Prescriptions  Pending Prescriptions Disp Refills   ibuprofen (ADVIL) 800 MG tablet [Pharmacy Med Name: IBUPROFEN 800 MG TABLET] 90 tablet 1    Sig: TAKE 1 TABLET BY MOUTH EVERY 8 HOURS AS NEEDED     Analgesics:  NSAIDS Failed - 08/08/2022  3:39 PM      Failed - Manual Review: Labs are only required if the patient has taken medication for more than 8 weeks.      Passed - Cr in normal range and within 360 days    Creat  Date Value Ref Range Status  12/14/2021 1.06 0.70 - 1.30 mg/dL Final         Passed - HGB in normal range and within 360 days    Hemoglobin  Date Value Ref Range Status  12/14/2021 13.5 13.2 - 17.1 g/dL Final         Passed - PLT in normal range and within 360 days    Platelets  Date Value Ref Range Status  12/14/2021 228 140 - 400 Thousand/uL Final         Passed - HCT in normal range and within 360 days    HCT  Date Value Ref Range Status  12/14/2021 43.7 38.5 - 50.0 % Final         Passed - eGFR is 30 or above and within 360 days    eGFR  Date Value Ref Range Status  12/14/2021 85 > OR = 60 mL/min/1.57m2 Final    Comment:    The eGFR is based on the CKD-EPI 2021 equation. To calculate  the new eGFR from a previous Creatinine or Cystatin C result, go to https://www.kidney.org/professionals/ kdoqi/gfr%5Fcalculator          Passed - Patient is not pregnant      Passed - Valid encounter within last 12 months    Recent Outpatient Visits           1 month ago Essential hypertension   [place], FNP   7 months ago Essential hypertension   [facility] Bo Merino, FNP       Future Appointments             In 4 months Reece Packer, Myna Hidalgo, [facility], [facility]

## 2022-08-10 ENCOUNTER — Other Ambulatory Visit: Payer: Self-pay | Admitting: Nurse Practitioner

## 2022-08-10 DIAGNOSIS — G43009 Migraine without aura, not intractable, without status migrainosus: Secondary | ICD-10-CM

## 2022-08-12 NOTE — Telephone Encounter (Signed)
Requested medication (s) are due for refill today: routing for review  Requested medication (s) are on the active medication list: yes  Last refill:  06/13/22  Future visit scheduled: yes  Notes to clinic:  Unable to refill per protocol, routing for review. If patient has taken the medication as prescribed, refill needed.     Requested Prescriptions  Pending Prescriptions Disp Refills   ibuprofen (ADVIL) 800 MG tablet [Pharmacy Med Name: IBUPROFEN 800 MG TABLET] 90 tablet 1    Sig: TAKE 1 TABLET BY MOUTH EVERY 8 HOURS AS NEEDED     Analgesics:  NSAIDS Failed - 08/10/2022  3:38 PM      Failed - Manual Review: Labs are only required if the patient has taken medication for more than 8 weeks.      Passed - Cr in normal range and within 360 days    Creat  Date Value Ref Range Status  12/14/2021 1.06 0.70 - 1.30 mg/dL Final         Passed - HGB in normal range and within 360 days    Hemoglobin  Date Value Ref Range Status  12/14/2021 13.5 13.2 - 17.1 g/dL Final         Passed - PLT in normal range and within 360 days    Platelets  Date Value Ref Range Status  12/14/2021 228 140 - 400 Thousand/uL Final         Passed - HCT in normal range and within 360 days    HCT  Date Value Ref Range Status  12/14/2021 43.7 38.5 - 50.0 % Final         Passed - eGFR is 30 or above and within 360 days    eGFR  Date Value Ref Range Status  12/14/2021 85 > OR = 60 mL/min/1.109m2 Final    Comment:    The eGFR is based on the CKD-EPI 2021 equation. To calculate  the new eGFR from a previous Creatinine or Cystatin C result, go to https://www.kidney.org/professionals/ kdoqi/gfr%5Fcalculator          Passed - Patient is not pregnant      Passed - Valid encounter within last 12 months    Recent Outpatient Visits           2 months ago Essential hypertension   [place], FNP   8 months ago Essential hypertension   [facility] Bo Merino, FNP       Future Appointments             In 4 months Reece Packer, Myna Hidalgo, [facility], [facility]

## 2022-10-14 ENCOUNTER — Other Ambulatory Visit: Payer: Self-pay | Admitting: Nurse Practitioner

## 2022-10-14 DIAGNOSIS — G43009 Migraine without aura, not intractable, without status migrainosus: Secondary | ICD-10-CM

## 2022-10-15 NOTE — Telephone Encounter (Signed)
Requested Prescriptions  Pending Prescriptions Disp Refills   ibuprofen (ADVIL) 800 MG tablet [Pharmacy Med Name: IBUPROFEN 800 MG TABLET] 90 tablet 1    Sig: TAKE 1 TABLET BY MOUTH EVERY 8 HOURS AS NEEDED     Analgesics:  NSAIDS Failed - 10/14/2022  8:38 AM      Failed - Manual Review: Labs are only required if the patient has taken medication for more than 8 weeks.      Passed - Cr in normal range and within 360 days    Creat  Date Value Ref Range Status  12/14/2021 1.06 0.70 - 1.30 mg/dL Final         Passed - HGB in normal range and within 360 days    Hemoglobin  Date Value Ref Range Status  12/14/2021 13.5 13.2 - 17.1 g/dL Final         Passed - PLT in normal range and within 360 days    Platelets  Date Value Ref Range Status  12/14/2021 228 140 - 400 Thousand/uL Final         Passed - HCT in normal range and within 360 days    HCT  Date Value Ref Range Status  12/14/2021 43.7 38.5 - 50.0 % Final         Passed - eGFR is 30 or above and within 360 days    eGFR  Date Value Ref Range Status  12/14/2021 85 > OR = 60 mL/min/1.76m2 Final    Comment:    The eGFR is based on the CKD-EPI 2021 equation. To calculate  the new eGFR from a previous Creatinine or Cystatin C result, go to https://www.kidney.org/professionals/ kdoqi/gfr%5Fcalculator          Passed - Patient is not pregnant      Passed - Valid encounter within last 12 months    Recent Outpatient Visits           4 months ago Essential hypertension   [facility] Berniece Salines, FNP   10 months ago Essential hypertension   [facility] Berniece Salines, FNP       Future Appointments             In 1 month Zane Herald, Rudolpho Sevin, FNP [facility], [facility]

## 2022-12-06 ENCOUNTER — Other Ambulatory Visit: Payer: Self-pay | Admitting: Nurse Practitioner

## 2022-12-06 DIAGNOSIS — I10 Essential (primary) hypertension: Secondary | ICD-10-CM

## 2022-12-06 NOTE — Telephone Encounter (Signed)
Requested medication (s) are due for refill today:yes  Requested medication (s) are on the active medication list:yes  Last refill:  06/13/22  #90  1 RF  Future visit scheduled:yes  Notes to clinic:  overdue lab work - has appt soon   Requested Prescriptions  Pending Prescriptions Disp Refills   lisinopril (ZESTRIL) 40 MG tablet [Pharmacy Med Name: LISINOPRIL 40 MG TABLET] 90 tablet 1    Sig: TAKE 1 TABLET BY MOUTH DAILY     Cardiovascular:  ACE Inhibitors Failed - 12/06/2022  6:22 AM      Failed - Cr in normal range and within 180 days    Creat  Date Value Ref Range Status  12/14/2021 1.06 0.70 - 1.30 mg/dL Final         Failed - K in normal range and within 180 days    Potassium  Date Value Ref Range Status  12/14/2021 4.1 3.5 - 5.3 mmol/L Final         Passed - Patient is not pregnant      Passed - Last BP in normal range    BP Readings from Last 1 Encounters:  06/13/22 132/74         Passed - Valid encounter within last 6 months    Recent Outpatient Visits           5 months ago Essential hypertension   [facility] Berniece Salines, FNP   11 months ago Essential hypertension   [facility] Berniece Salines, FNP       Future Appointments             In 6 days Zane Herald, Rudolpho Sevin, FNP [facility], [facility]

## 2022-12-11 NOTE — Progress Notes (Signed)
Name: Timothy Klein   MRN: [id number]    DOB: 1971-01-22   Date:12/12/2022       Progress Note  Subjective  Chief Complaint  Chief Complaint  Patient presents with   Annual Exam    HPI  Patient presents for annual CPE  and acute issue, aware of possible charge  Rectal pain: patient reports that for the last few months he has had a deep rectal pain that comes and goes. He says it is sharp, it is not painful when he has a BM. He denies any blood in his stool. He had a negative cologuard.  Discussed it could be referred prostate pain. Will get PSA and urine.    IPSS Questionnaire (AUA-7): Over the past month.   1)  How often have you had a sensation of not emptying your bladder completely after you finish urinating?  0 - Not at all  2)  How often have you had to urinate again less than two hours after you finished urinating? 0 - Not at all  3)  How often have you found you stopped and started again several times when you urinated?  0 - Not at all  4) How difficult have you found it to postpone urination?  0 - Not at all  5) How often have you had a weak urinary stream?  0 - Not at all  6) How often have you had to push or strain to begin urination?  0 - Not at all  7) How many times did you most typically get up to urinate from the time you went to bed until the time you got up in the morning?  0 - None  Total score:  0-7 mildly symptomatic   8-19 moderately symptomatic   20-35 severely symptomatic     Diet: Regular, does intermittent fasting Exercise: walk 10 miles a day for jb. Have a10K in December 2024 Last Dental Exam: 2005 Last Eye Exam: 2022  Depression: phq 9 is negative    12/12/2022    2:39 PM 06/13/2022    3:06 PM 12/14/2021    1:06 PM  Depression screen PHQ 2/9  Decreased Interest 0 0 0  Down, Depressed, Hopeless 0 0 0  PHQ - 2 Score 0 0 0    Hypertension:  BP Readings from Last 3 Encounters:  12/12/22 130/82  06/13/22 132/74  12/14/21 124/80     Obesity: Wt Readings from Last 3 Encounters:  12/12/22 183 lb 12.8 oz (83.4 kg)  06/13/22 191 lb 6.4 oz (86.8 kg)  12/14/21 208 lb 14.4 oz (94.8 kg)   BMI Readings from Last 3 Encounters:  12/12/22 27.95 kg/m  06/13/22 29.10 kg/m  12/14/21 31.76 kg/m     Lipids:  Lab Results  Component Value Date   CHOL 167 12/14/2021   Lab Results  Component Value Date   HDL 39 (L) 12/14/2021   Lab Results  Component Value Date   LDLCALC 105 (H) 12/14/2021   Lab Results  Component Value Date   TRIG 129 12/14/2021   Lab Results  Component Value Date   CHOLHDL 4.3 12/14/2021   No results found for: "LDLDIRECT" Glucose:  Glucose, Bld  Date Value Ref Range Status  12/14/2021 86 65 - 99 mg/dL Final    Comment:    .            Fasting reference interval .     Flowsheet Row [facility]  AUDIT-C Score 1       Married STD testing and prevention (HIV/chl/gon/syphilis):  no completed Sexual history: sexually active Hep C Screening: completed Skin cancer: Discussed monitoring for atypical lesions Colorectal cancer: March 2022  Prostate cancer:  no No results found for: "PSA"   Lung cancer:  Low Dose CT Chest recommended if Age 4-80 years, 30 pack-year currently smoking OR have quit w/in 15years. Patient  not applicable a candidate for screening   AAA: The USPSTF recommends one-time screening with ultrasonography in men ages 5 to 75 years who have ever smoked. Patient   not applicable, a candidate for screening  ECG:  07/25/2014  Vaccines:  HPV: up to at age 40 , ask insurance if age between 72-45  Shingrix: 90-64 yo and ask insurance if covered when patient above 52 yo Pneumonia:  educated and discussed with patient. Flu:  educated and discussed with patient.    Advanced Care Planning: A voluntary discussion about advance care planning including the explanation and discussion of advance directives.   Discussed health care proxy and Living will, and the patient was able to identify a health care proxy as phyllis.  Patient does not have a living will and power of attorney of health care   Patient Active Problem List   Diagnosis Date Noted   Mixed hyperlipidemia 06/13/2022   Essential hypertension 12/14/2021   Migraine without aura and without status migrainosus, not intractable 12/14/2021    Past Surgical History:  Procedure Laterality Date   ELBOW ARTHROPLASTY Left     Family History  Problem Relation Age of Onset   Hypertension Mother    Hypertension Father     Social History   Socioeconomic History   Marital status: Married    Spouse name: phyllis   Number of children: 1   Years of education: Not on file   Highest education level: Not on file  Occupational History   Not on file  Tobacco Use   Smoking status: Former    Current packs/day: 0.00    Types: Cigarettes    Quit date: 11/27/2011    Years since quitting: 11.0   Smokeless tobacco: Never  Vaping Use   Vaping status: Never Used  Substance and Sexual Activity   Alcohol use: Yes   Drug use: Never   Sexual activity: Yes    Partners: Female  Other Topics Concern   Not on file  Social History Narrative   Not on file   Social Determinants of Health   Financial Resource Strain: Low Risk  (12/12/2022)   Overall Financial Resource Strain (CARDIA)    Difficulty of Paying Living Expenses: Not hard at all  Food Insecurity: No Food Insecurity (12/12/2022)   Hunger Vital Sign    Worried About Running Out of Food in the Last Year: Never true    Ran Out of Food in the Last Year: Never true  Transportation Needs: No Transportation Needs (12/12/2022)   PRAPARE - Administrator, Civil Service (Medical): No    Lack of Transportation (Non-Medical): No  Physical Activity: Sufficiently Active (12/12/2022)   Exercise Vital Sign    Days of Exercise per Week: 5 days    Minutes of Exercise per Session: 120 min   Stress: No Stress Concern Present (12/12/2022)   [employer] of Occupational Health - Occupational Stress Questionnaire    Feeling of Stress : Not at all  Social Connections: Moderately Integrated (12/12/2022)   Social Connection and Isolation Panel [NHANES]  Frequency of Communication with Friends and Family: More than three times a week    Frequency of Social Gatherings with Friends and Family: More than three times a week    Attends Religious Services: 1 to 4 times per year    Active Member of [employer] or Organizations: No    Attends Banker Meetings: Never    Marital Status: Married  Catering manager Violence: Not At Risk (12/12/2022)   Humiliation, Afraid, Rape, and Kick questionnaire    Fear of Current or Ex-Partner: No    Emotionally Abused: No    Physically Abused: No    Sexually Abused: No     Current Outpatient Medications:    amLODipine (NORVASC) 10 MG tablet, Take 1 tablet (10 mg total) by mouth daily., Disp: 90 tablet, Rfl: 1   ibuprofen (ADVIL) 800 MG tablet, TAKE 1 TABLET BY MOUTH EVERY 8 HOURS AS NEEDED, Disp: 90 tablet, Rfl: 1   lisinopril (ZESTRIL) 40 MG tablet, TAKE 1 TABLET BY MOUTH DAILY, Disp: 90 tablet, Rfl: 1   furosemide (LASIX) 20 MG tablet, Take 1 tablet (20 mg total) by mouth daily as needed for edema. (Patient not taking: Reported on 12/12/2022), Disp: 30 tablet, Rfl: 0  No Known Allergies   ROS  Constitutional: Negative for fever or weight change.  Respiratory: Negative for cough and shortness of breath.   Cardiovascular: Negative for chest pain or palpitations.  Gastrointestinal: Negative for abdominal pain, no bowel changes.  Musculoskeletal: Negative for gait problem or joint swelling.  Skin: Negative for rash.  Neurological: Negative for dizziness or headache.  No other specific complaints in a complete review of systems (except as listed in HPI above).    Objective  Vitals:   12/12/22 1439  BP: 130/82  Pulse: 83  Resp:  16  Temp: 98.1 F (36.7 C)  TempSrc: Oral  SpO2: 97%  Weight: 183 lb 12.8 oz (83.4 kg)    Body mass index is 27.95 kg/m.  Physical Exam  Constitutional: Patient appears well-developed and well-nourished. No distress.  HENT: Head: Normocephalic and atraumatic. Ears: B TMs ok, no erythema or effusion; Nose: Nose normal. Mouth/Throat: Oropharynx is clear and moist. No oropharyngeal exudate.  Eyes: Conjunctivae and EOM are normal. Pupils are equal, round, and reactive to light. No scleral icterus.  Neck: Normal range of motion. Neck supple. No JVD present. No thyromegaly present.  Cardiovascular: Normal rate, regular rhythm and normal heart sounds.  No murmur heard. No BLE edema. Pulmonary/Chest: Effort normal and breath sounds normal. No respiratory distress. Abdominal: Soft. Bowel sounds are normal, no distension. There is no tenderness. no masses Musculoskeletal: Normal range of motion, no joint effusions. No gross deformities Neurological: he is alert and oriented to person, place, and time. No cranial nerve deficit. Coordination, balance, strength, speech and gait are normal.  Skin: Skin is warm and dry. No rash noted. No erythema.  Psychiatric: Patient has a normal mood and affect. behavior is normal. Judgment and thought content normal.   No results found for this or any previous visit (from the past 2160 hour(s)).   Fall Risk:    12/12/2022    2:38 PM 06/13/2022    3:06 PM 12/14/2021    1:05 PM  Fall Risk   Falls in the past year? 0 0 0  Number falls in past yr: 0 0 0  Injury with Fall? 0 0 0  Follow up  Falls evaluation completed Falls evaluation completed     Functional Status Survey:  Is the patient deaf or have difficulty hearing?: No Does the patient have difficulty seeing, even when wearing glasses/contacts?: No Does the patient have difficulty concentrating, remembering, or making decisions?: No Does the patient have difficulty walking or climbing stairs?:  No Does the patient have difficulty dressing or bathing?: No Does the patient have difficulty doing errands alone such as visiting a doctor's office or shopping?: No    Assessment & Plan  1. Annual physical exam  - CBC with Differential/Platelet - Lipid panel - COMPLETE METABOLIC PANEL WITH GFR - Hemoglobin A1c - PSA  2. Essential hypertension  - CBC with Differential/Platelet - COMPLETE METABOLIC PANEL WITH GFR  3. Mixed hyperlipidemia  - Lipid panel  4. Screening for deficiency anemia  - CBC with Differential/Platelet  5. Screening for prostate cancer  - PSA  6. Screening for diabetes mellitus  - COMPLETE METABOLIC PANEL WITH GFR - Hemoglobin A1c   7. Pain, rectal - will get labs, may need to refer to urology - Urine Culture - Urinalysis, Complete - Urine cytology ancillary only   -Prostate cancer screening and PSA options (with potential risks and benefits of testing vs not testing) were discussed along with recent recs/guidelines. -USPSTF grade A and B recommendations reviewed with patient; age-appropriate recommendations, preventive care, screening tests, etc discussed and encouraged; healthy living encouraged; see AVS for patient education given to patient -Discussed importance of 150 minutes of physical activity weekly, eat two servings of fish weekly, eat one serving of tree nuts ( cashews, pistachios, pecans, almonds.Marland Kitchen) every other day, eat 6 servings of fruit/vegetables daily and drink plenty of water and avoid sweet beverages.  -Reviewed Health Maintenance: yes

## 2022-12-12 ENCOUNTER — Other Ambulatory Visit (HOSPITAL_COMMUNITY)
Admission: RE | Admit: 2022-12-12 | Discharge: 2022-12-12 | Disposition: A | Discharge status: Home / Self Care | Payer: BC Managed Care – PPO | Source: Ambulatory Visit | Attending: Nurse Practitioner | Admitting: Nurse Practitioner

## 2022-12-12 ENCOUNTER — Ambulatory Visit (INDEPENDENT_AMBULATORY_CARE_PROVIDER_SITE_OTHER): Payer: BC Managed Care – PPO | Admitting: Nurse Practitioner

## 2022-12-12 ENCOUNTER — Other Ambulatory Visit: Payer: Self-pay

## 2022-12-12 ENCOUNTER — Encounter: Payer: Self-pay | Admitting: Nurse Practitioner

## 2022-12-12 VITALS — BP 130/82 | HR 83 | Temp 98.1°F | Resp 16 | Wt 183.8 lb

## 2022-12-12 DIAGNOSIS — I10 Essential (primary) hypertension: Secondary | ICD-10-CM | POA: Diagnosis not present

## 2022-12-12 DIAGNOSIS — E782 Mixed hyperlipidemia: Secondary | ICD-10-CM

## 2022-12-12 DIAGNOSIS — K6289 Other specified diseases of anus and rectum: Secondary | ICD-10-CM | POA: Insufficient documentation

## 2022-12-12 DIAGNOSIS — Z125 Encounter for screening for malignant neoplasm of prostate: Secondary | ICD-10-CM

## 2022-12-12 DIAGNOSIS — Z131 Encounter for screening for diabetes mellitus: Secondary | ICD-10-CM

## 2022-12-12 DIAGNOSIS — Z0000 Encounter for general adult medical examination without abnormal findings: Secondary | ICD-10-CM | POA: Diagnosis not present

## 2022-12-12 DIAGNOSIS — Z130 Encounter for screening for diseases of the blood and blood-forming organs and certain disorders involving the immune mechanism: Secondary | ICD-10-CM | POA: Diagnosis not present

## 2022-12-13 ENCOUNTER — Other Ambulatory Visit: Payer: Self-pay | Admitting: Emergency Medicine

## 2022-12-13 DIAGNOSIS — Z125 Encounter for screening for malignant neoplasm of prostate: Secondary | ICD-10-CM

## 2022-12-13 DIAGNOSIS — K6289 Other specified diseases of anus and rectum: Secondary | ICD-10-CM

## 2022-12-13 LAB — COMPLETE METABOLIC PANEL WITH GFR
AG Ratio: 1.8 (calc) (ref 1.0–2.5)
ALT: 23 U/L (ref 9–46)
AST: 22 U/L (ref 10–35)
Albumin: 4.9 g/dL (ref 3.6–5.1)
Alkaline phosphatase (APISO): 69 U/L (ref 35–144)
BUN: 16 mg/dL (ref 7–25)
CO2: 27 mmol/L (ref 20–32)
Calcium: 10 mg/dL (ref 8.6–10.3)
Chloride: 106 mmol/L (ref 98–110)
Creat: 0.98 mg/dL (ref 0.70–1.30)
Globulin: 2.7 g/dL (calc) (ref 1.9–3.7)
Glucose, Bld: 87 mg/dL (ref 65–99)
Potassium: 4.4 mmol/L (ref 3.5–5.3)
Sodium: 142 mmol/L (ref 135–146)
Total Bilirubin: 1 mg/dL (ref 0.2–1.2)
Total Protein: 7.6 g/dL (ref 6.1–8.1)
eGFR: 93 mL/min/{1.73_m2} (ref >=60)

## 2022-12-13 LAB — CBC WITH DIFFERENTIAL/PLATELET
Absolute Monocytes: 446 cells/uL (ref 200–950)
Basophils Absolute: 73 cells/uL (ref 0–200)
Basophils Relative: 0.9 %
Eosinophils Absolute: 57 cells/uL (ref 15–500)
Eosinophils Relative: 0.7 %
HCT: 41.6 % (ref 38.5–50.0)
Hemoglobin: 12.9 g/dL — ABNORMAL LOW (ref 13.2–17.1)
Lymphs Abs: 1701 cells/uL (ref 850–3900)
MCH: 20.8 pg — ABNORMAL LOW (ref 27.0–33.0)
MCHC: 31 g/dL — ABNORMAL LOW (ref 32.0–36.0)
MCV: 67.2 fL — ABNORMAL LOW (ref 80.0–100.0)
MPV: 12.5 fL (ref 7.5–12.5)
Monocytes Relative: 5.5 %
Neutro Abs: 5824 cells/uL (ref 1500–7800)
Neutrophils Relative %: 71.9 %
Platelets: 278 10*3/uL (ref 140–400)
RBC: 6.19 10*6/uL — ABNORMAL HIGH (ref 4.20–5.80)
RDW: 16.4 % — ABNORMAL HIGH (ref 11.0–15.0)
Total Lymphocyte: 21 %
WBC: 8.1 10*3/uL (ref 3.8–10.8)

## 2022-12-13 LAB — HEMOGLOBIN A1C
Hgb A1c MFr Bld: 5.7 % of total Hgb — ABNORMAL HIGH (ref <5.7)
Mean Plasma Glucose: 117 mg/dL
eAG (mmol/L): 6.5 mmol/L

## 2022-12-13 LAB — LIPID PANEL
Cholesterol: 161 mg/dL (ref <200)
HDL: 59 mg/dL (ref >=40)
LDL Cholesterol (Calc): 88 mg/dL (calc)
Non-HDL Cholesterol (Calc): 102 mg/dL (calc) (ref <130)
Total CHOL/HDL Ratio: 2.7 (calc) (ref <5.0)
Triglycerides: 49 mg/dL (ref <150)

## 2022-12-13 LAB — PSA: PSA: 0.54 ng/mL (ref <=4.00)

## 2022-12-17 ENCOUNTER — Other Ambulatory Visit: Payer: Self-pay | Admitting: Emergency Medicine

## 2022-12-17 DIAGNOSIS — K6289 Other specified diseases of anus and rectum: Secondary | ICD-10-CM

## 2022-12-17 LAB — URINE CULTURE

## 2022-12-17 LAB — URINALYSIS, COMPLETE

## 2022-12-17 LAB — SPECIMEN STATUS REPORT

## 2022-12-17 NOTE — Progress Notes (Signed)
Patient to ome by to leave specimen

## 2022-12-18 LAB — URINE CULTURE
MICRO NUMBER:: 15236573
Result:: NO GROWTH
SPECIMEN QUALITY:: ADEQUATE

## 2022-12-18 LAB — URINE CYTOLOGY ANCILLARY ONLY
Chlamydia: NEGATIVE
Comment: NEGATIVE
Comment: NORMAL
Neisseria Gonorrhea: NEGATIVE

## 2022-12-18 LAB — SPECIMEN STATUS REPORT

## 2022-12-23 ENCOUNTER — Encounter: Payer: Self-pay | Admitting: Nurse Practitioner

## 2022-12-23 ENCOUNTER — Ambulatory Visit: Payer: Self-pay

## 2022-12-23 NOTE — Telephone Encounter (Signed)
Passed out on Saturday, got dizzy  next thing he knew he was on floor.

## 2022-12-23 NOTE — Telephone Encounter (Signed)
  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  Pertinent Negatives: Patient denies weakness or numbness to face arms or legs, chest pt headache or vision problems Disposition: [] ED /[] Urgent Care (no appt availability in office) / [x] Appointment(In office/virtual)/ []  [place] [facility]/ [] Home Care/ [] Refused Recommended Disposition /[] [facility]/ []  Follow-up with PCP Additional Notes: pt stated he is walking and talking fine- has walked 6 miles today without difficulty - want to see PCP only. Please ask PCP to review note.  Reason for Disposition  [1] All other patients AND [2] now alert and feels fine  (Exception: SIMPLE FAINT due to stress, pain, prolonged standing, or suddenly standing)  Answer Assessment - Initial Assessment Questions 1. ONSET: "How long were you unconscious?" (minutes) "When did it happen?"     Sat am- sweat profusely  2. CONTENT: "What happened during period of unconsciousness?" (e.g., seizure activity)      no 3. MENTAL STATUS: "Alert and oriented now?" (oriented x 3 = name, month, location)      yes 4. TRIGGER: "What do you think caused the fainting?" "What were you doing just before you fainted?"  (e.g., exercise, sudden standing up, prolonged standing)     Standing ? Lack of sleep   5. RECURRENT SYMPTOM: "Have you ever passed out before?" If Yes, ask: "When was the last time?" and "What happened that time?"      Years got shot to novo caine and passed out  6. INJURY: "Did you sustain any injury during the fall?"      no 7. CARDIAC SYMPTOMS: "Have you had any of the following symptoms: chest pain, difficulty breathing, palpitations?"     No  8. NEUROLOGIC SYMPTOMS: "Have you had any of the following symptoms: headache, numbness, vertigo, weakness?"     No  9. GI SYMPTOMS: "Have you had any of  the following symptoms: abdomen pain, vomiting, diarrhea, blood in stools?"      10. OTHER SYMPTOMS: "Do you have any other symptoms?"       Fatigue and fogginess  Protocols used: Fainting-A-AH

## 2022-12-25 ENCOUNTER — Encounter: Payer: Self-pay | Admitting: Nurse Practitioner

## 2022-12-25 ENCOUNTER — Other Ambulatory Visit: Payer: Self-pay

## 2022-12-25 ENCOUNTER — Ambulatory Visit: Payer: BC Managed Care – PPO | Admitting: Nurse Practitioner

## 2022-12-25 VITALS — BP 122/78 | HR 73 | Temp 98.1°F | Resp 16 | Ht 68.0 in | Wt 181.5 lb

## 2022-12-25 DIAGNOSIS — R55 Syncope and collapse: Secondary | ICD-10-CM | POA: Diagnosis not present

## 2022-12-25 NOTE — Progress Notes (Signed)
   BP 122/78   Pulse 73   Temp 98.1 F (36.7 C) (Oral)   Resp 16   Ht 5\' 8"  (1.727 m)   Wt 181 lb 8 oz (82.3 kg)   SpO2 97%   BMI 27.60 kg/m    Subjective:    Patient ID: Timothy Klein, male    DOB: 04-Nov-1970, 52 y.o.   MRN: [id number]  HPI: Timothy Klein is a 52 y.o. male  Chief Complaint  Patient presents with   Loss of Consciousness   Syncopal episode: he reports that on Saturday he had a syncopal episode. He was indoors, with air conditioning. He denies any chest pain, shortness of breath, headache. Syncopal episode was witnessed. He says before he passed out he was dizzy. He says he was out for about a minute.  No jerking or seizure like activity was witnessed.  He says he was confused when he first woke up.  Patient reports when he came to that he was then diaphoretic.  He denies any numbness or tingling in extremities.  He says that he stayed the rest of the day and continued to work.   He reports that he has not had another incident.  He reports he feels fine.  EKG: sinus brady Discussed making sure he is drinking plenty of fluids.     Relevant past medical, surgical, family and social history reviewed and updated as indicated. Interim medical history since our last visit reviewed. Allergies and medications reviewed and updated.  Review of Systems  Constitutional: Negative for fever or weight change.  Respiratory: Negative for cough and shortness of breath.   Cardiovascular: Negative for chest pain or palpitations.  Gastrointestinal: Negative for abdominal pain, no bowel changes.  Musculoskeletal: Negative for gait problem or joint swelling.  Skin: Negative for rash.  Neurological: Negative for dizziness or headache.  No other specific complaints in a complete review of systems (except as listed in HPI above).      Objective:    BP 122/78   Pulse 73   Temp 98.1 F (36.7 C) (Oral)   Resp 16   Ht 5\' 8"  (1.727 m)   Wt 181 lb 8 oz (82.3 kg)   SpO2 97%   BMI  27.60 kg/m   Wt Readings from Last 3 Encounters:  12/25/22 181 lb 8 oz (82.3 kg)  12/12/22 183 lb 12.8 oz (83.4 kg)  06/13/22 191 lb 6.4 oz (86.8 kg)    Physical Exam  Constitutional: Patient appears well-developed and well-nourished.  No distress.  HEENT: head atraumatic, normocephalic, pupils equal and reactive to light, neck supple, throat within normal limits Cardiovascular: Normal rate, regular rhythm and normal heart sounds.  No murmur heard. No BLE edema. Pulmonary/Chest: Effort normal and breath sounds normal. No respiratory distress. Abdominal: Soft.  There is no tenderness. Psychiatric: Patient has a normal mood and affect. behavior is normal. Judgment and thought content normal.  Results for orders placed or performed in visit on 12/23/22  Cologuard  Result Value Ref Range   Cologuard Negative Negative      Assessment & Plan:   Problem List Items Addressed This Visit   None Visit Diagnoses     Syncope, unspecified syncope type    -  Primary   ekg sinus brady, push fluids        Follow up plan: Return if symptoms worsen or fail to improve.

## 2022-12-26 ENCOUNTER — Other Ambulatory Visit: Payer: Self-pay | Admitting: Nurse Practitioner

## 2022-12-26 ENCOUNTER — Encounter: Payer: Self-pay | Admitting: Nurse Practitioner

## 2022-12-26 DIAGNOSIS — G43009 Migraine without aura, not intractable, without status migrainosus: Secondary | ICD-10-CM

## 2022-12-27 NOTE — Telephone Encounter (Signed)
Requested Prescriptions  Pending Prescriptions Disp Refills   ibuprofen (ADVIL) 800 MG tablet [Pharmacy Med Name: IBUPROFEN 800 MG TABLET] 90 tablet 1    Sig: TAKE 1 TABLET BY MOUTH EVERY 8 HOURS AS NEEDED     Analgesics:  NSAIDS Failed - 12/26/2022  4:56 PM      Failed - Manual Review: Labs are only required if the patient has taken medication for more than 8 weeks.      Failed - HGB in normal range and within 360 days    Hemoglobin  Date Value Ref Range Status  12/12/2022 12.9 (L) 13.2 - 17.1 g/dL Final         Passed - Cr in normal range and within 360 days    Creat  Date Value Ref Range Status  12/12/2022 0.98 0.70 - 1.30 mg/dL Final         Passed - PLT in normal range and within 360 days    Platelets  Date Value Ref Range Status  12/12/2022 278 140 - 400 Thousand/uL Final         Passed - HCT in normal range and within 360 days    HCT  Date Value Ref Range Status  12/12/2022 41.6 38.5 - 50.0 % Final         Passed - eGFR is 30 or above and within 360 days    eGFR  Date Value Ref Range Status  12/12/2022 93 > OR = 60 mL/min/1.65m2 Final         Passed - Patient is not pregnant      Passed - Valid encounter within last 12 months    Recent Outpatient Visits           2 days ago Syncope, unspecified syncope type   [facility] Berniece Salines, FNP   2 weeks ago Annual physical exam   [facility] Berniece Salines, FNP   6 months ago Essential hypertension   [facility] Berniece Salines, FNP   1 year ago Essential hypertension   [facility] Berniece Salines, FNP       Future Appointments             In 11 months Zane Herald, Rudolpho Sevin, FNP [facility], [facility]

## 2023-01-07 ENCOUNTER — Other Ambulatory Visit: Payer: Self-pay | Admitting: Nurse Practitioner

## 2023-02-28 ENCOUNTER — Other Ambulatory Visit: Payer: Self-pay | Admitting: Nurse Practitioner

## 2023-02-28 DIAGNOSIS — G43009 Migraine without aura, not intractable, without status migrainosus: Secondary | ICD-10-CM

## 2023-03-03 NOTE — Telephone Encounter (Signed)
Requested Prescriptions  Pending Prescriptions Disp Refills   ibuprofen (ADVIL) 800 MG tablet [Pharmacy Med Name: IBUPROFEN 800 MG TABLET] 90 tablet 1    Sig: TAKE 1 TABLET BY MOUTH EVERY 8 HOURS AS NEEDED     Analgesics:  NSAIDS Failed - 02/28/2023  5:27 PM      Failed - Manual Review: Labs are only required if the patient has taken medication for more than 8 weeks.      Failed - HGB in normal range and within 360 days    Hemoglobin  Date Value Ref Range Status  12/12/2022 12.9 (L) 13.2 - 17.1 g/dL Final         Passed - Cr in normal range and within 360 days    Creat  Date Value Ref Range Status  12/12/2022 0.98 0.70 - 1.30 mg/dL Final         Passed - PLT in normal range and within 360 days    Platelets  Date Value Ref Range Status  12/12/2022 278 140 - 400 Thousand/uL Final         Passed - HCT in normal range and within 360 days    HCT  Date Value Ref Range Status  12/12/2022 41.6 38.5 - 50.0 % Final         Passed - eGFR is 30 or above and within 360 days    eGFR  Date Value Ref Range Status  12/12/2022 93 > OR = 60 mL/min/1.23m2 Final         Passed - Patient is not pregnant      Passed - Valid encounter within last 12 months    Recent Outpatient Visits           2 months ago Syncope, unspecified syncope type   [facility] Berniece Salines, FNP   2 months ago Annual physical exam   [facility] Berniece Salines, FNP   8 months ago Essential hypertension   [facility] Berniece Salines, FNP   1 year ago Essential hypertension   [facility] Berniece Salines, FNP       Future Appointments             In 9 months Zane Herald, Rudolpho Sevin, FNP [facility], [facility]

## 2023-03-04 ENCOUNTER — Other Ambulatory Visit: Payer: Self-pay | Admitting: Emergency Medicine

## 2023-03-04 DIAGNOSIS — G43009 Migraine without aura, not intractable, without status migrainosus: Secondary | ICD-10-CM

## 2023-03-04 MED ORDER — IBUPROFEN 800 MG PO TABS: 800.0000 mg | ORAL_TABLET | Freq: Three times a day (TID) | ORAL | 1 refills | Status: DC | PRN

## 2023-03-24 ENCOUNTER — Other Ambulatory Visit: Payer: Self-pay | Admitting: Nurse Practitioner

## 2023-03-24 DIAGNOSIS — I10 Essential (primary) hypertension: Secondary | ICD-10-CM

## 2023-03-25 NOTE — Telephone Encounter (Signed)
Requested Prescriptions  Pending Prescriptions Disp Refills   amLODipine (NORVASC) 10 MG tablet [Pharmacy Med Name: amLODIPine BESYLATE 10MG  TAB] 90 tablet 1    Sig: TAKE 1 TABLET BY MOUTH DAILY     Cardiovascular: Calcium Channel Blockers 2 Passed - 03/24/2023  8:06 AM      Passed - Last BP in normal range    BP Readings from Last 1 Encounters:  12/25/22 122/78         Passed - Last Heart Rate in normal range    Pulse Readings from Last 1 Encounters:  12/25/22 73         Passed - Valid encounter within last 6 months    Recent Outpatient Visits           3 months ago Syncope, unspecified syncope type   [facility] Berniece Salines, FNP   3 months ago Annual physical exam   [facility] Berniece Salines, FNP   9 months ago Essential hypertension   [facility] Berniece Salines, FNP   1 year ago Essential hypertension   [facility] Berniece Salines, FNP       Future Appointments             In 8 months Zane Herald, Rudolpho Sevin, FNP [facility], [facility]

## 2023-05-16 ENCOUNTER — Telehealth: Payer: Self-pay | Admitting: Nurse Practitioner

## 2023-05-16 NOTE — Telephone Encounter (Signed)
Pt called and just wanted Timothy Klein to know he went to the ER at [facility] and was advised he has kidney stones. They gave him medication and they are sending him to a urologist.

## 2023-06-08 ENCOUNTER — Other Ambulatory Visit: Payer: Self-pay | Admitting: Nurse Practitioner

## 2023-06-08 DIAGNOSIS — I10 Essential (primary) hypertension: Secondary | ICD-10-CM

## 2023-06-10 NOTE — Telephone Encounter (Signed)
 Requested Prescriptions  Pending Prescriptions Disp Refills   lisinopril  (ZESTRIL ) 40 MG tablet [Pharmacy Med Name: LISINOPRIL  40 MG TABLET] 90 tablet 1    Sig: TAKE 1 TABLET BY MOUTH DAILY     Cardiovascular:  ACE Inhibitors Passed - 06/10/2023 10:13 AM      Passed - Cr in normal range and within 180 days    Creat  Date Value Ref Range Status  12/12/2022 0.98 0.70 - 1.30 mg/dL Final         Passed - K in normal range and within 180 days    Potassium  Date Value Ref Range Status  12/12/2022 4.4 3.5 - 5.3 mmol/L Final         Passed - Patient is not pregnant      Passed - Last BP in normal range    BP Readings from Last 1 Encounters:  12/25/22 122/78         Passed - Valid encounter within last 6 months    Recent Outpatient Visits           5 months ago Syncope, unspecified syncope type   [facility] Gareth Mliss FALCON, FNP   6 months ago Annual physical exam   [facility] Gareth Mliss FALCON, FNP   12 months ago Essential hypertension   [facility] Gareth Mliss FALCON, FNP   1 year ago Essential hypertension   [facility] Gareth Mliss FALCON, FNP       Future Appointments             In 6 months Gareth, Mliss FALCON, FNP [facility], [facility]

## 2023-07-09 ENCOUNTER — Other Ambulatory Visit: Payer: Self-pay | Admitting: Nurse Practitioner

## 2023-07-09 DIAGNOSIS — G43009 Migraine without aura, not intractable, without status migrainosus: Secondary | ICD-10-CM

## 2023-07-10 NOTE — Telephone Encounter (Signed)
Requested medication (s) are due for refill today - provider review   Requested medication (s) are on the active medication list -yes  Future visit scheduled -yes  Last refill: 03/04/23 #90 1RF  Notes to clinic: Abnormal outside lab  Requested Prescriptions  Pending Prescriptions Disp Refills   ibuprofen (ADVIL) 800 MG tablet [Pharmacy Med Name: IBUPROFEN 800 MG TABLET] 90 tablet 1    Sig: TAKE 1 TABLET BY MOUTH EVERY 8 HOURS AS NEEDED     Analgesics:  NSAIDS Failed - 07/10/2023  3:10 PM      Failed - Manual Review: Labs are only required if the patient has taken medication for more than 8 weeks.      Failed - HGB in normal range and within 360 days    Hemoglobin  Date Value Ref Range Status  12/12/2022 12.9 (L) 13.2 - 17.1 g/dL Final         Passed - Cr in normal range and within 360 days    Creat  Date Value Ref Range Status  12/12/2022 0.98 0.70 - 1.30 mg/dL Final         Passed - PLT in normal range and within 360 days    Platelets  Date Value Ref Range Status  12/12/2022 278 140 - 400 Thousand/uL Final         Passed - HCT in normal range and within 360 days    HCT  Date Value Ref Range Status  12/12/2022 41.6 38.5 - 50.0 % Final         Passed - eGFR is 30 or above and within 360 days    eGFR  Date Value Ref Range Status  12/12/2022 93 > OR = 60 mL/min/1.40m2 Final         Passed - Patient is not pregnant      Passed - Valid encounter within last 12 months    Recent Outpatient Visits           6 months ago Syncope, unspecified syncope type   [facility] Berniece Salines, FNP   7 months ago Annual physical exam   [facility] Berniece Salines, FNP   1 year ago Essential hypertension   [facility] Berniece Salines, FNP   1 year ago Essential hypertension   [facility] Berniece Salines, FNP       Future Appointments             In 5 months  Zane Herald, Rudolpho Sevin, FNP [facility], [facility]               Requested Prescriptions  Pending Prescriptions Disp Refills   ibuprofen (ADVIL) 800 MG tablet [Pharmacy Med Name: IBUPROFEN 800 MG TABLET] 90 tablet 1    Sig: TAKE 1 TABLET BY MOUTH EVERY 8 HOURS AS NEEDED     Analgesics:  NSAIDS Failed - 07/10/2023  3:10 PM      Failed - Manual Review: Labs are only required if the patient has taken medication for more than 8 weeks.      Failed - HGB in normal range and within 360 days    Hemoglobin  Date Value Ref Range Status  12/12/2022 12.9 (L) 13.2 - 17.1 g/dL Final         Passed - Cr in normal range and within 360 days    Creat  Date Value Ref Range Status  12/12/2022  0.98 0.70 - 1.30 mg/dL Final         Passed - PLT in normal range and within 360 days    Platelets  Date Value Ref Range Status  12/12/2022 278 140 - 400 Thousand/uL Final         Passed - HCT in normal range and within 360 days    HCT  Date Value Ref Range Status  12/12/2022 41.6 38.5 - 50.0 % Final         Passed - eGFR is 30 or above and within 360 days    eGFR  Date Value Ref Range Status  12/12/2022 93 > OR = 60 mL/min/1.79m2 Final         Passed - Patient is not pregnant      Passed - Valid encounter within last 12 months    Recent Outpatient Visits           6 months ago Syncope, unspecified syncope type   [facility] Berniece Salines, FNP   7 months ago Annual physical exam   [facility] Berniece Salines, FNP   1 year ago Essential hypertension   [facility] Berniece Salines, FNP   1 year ago Essential hypertension   [facility] Berniece Salines, FNP       Future Appointments             In 5 months Zane Herald, Rudolpho Sevin, FNP [facility], [facility]

## 2023-09-11 ENCOUNTER — Other Ambulatory Visit: Payer: Self-pay | Admitting: Nurse Practitioner

## 2023-09-11 DIAGNOSIS — G43009 Migraine without aura, not intractable, without status migrainosus: Secondary | ICD-10-CM

## 2023-09-12 NOTE — Telephone Encounter (Signed)
 Too soon for refill.  Requested Prescriptions  Pending Prescriptions Disp Refills   ibuprofen  (ADVIL ) 800 MG tablet [Pharmacy Med Name: IBUPROFEN  800 MG TABLET] 90 tablet 1    Sig: TAKE 1 TABLET BY MOUTH EVERY 8 HOURS AS NEEDED     Analgesics:  NSAIDS Failed - 09/12/2023 11:45 AM      Failed - Manual Review: Labs are only required if the patient has taken medication for more than 8 weeks.      Failed - HGB in normal range and within 360 days    Hemoglobin  Date Value Ref Range Status  12/12/2022 12.9 (L) 13.2 - 17.1 g/dL Final         Failed - Valid encounter within last 12 months    Recent Outpatient Visits   None     Future Appointments             In 3 months Gareth, Mliss FALCON, FNP [facility]            Passed - Cr in normal range and within 360 days    Creat  Date Value Ref Range Status  12/12/2022 0.98 0.70 - 1.30 mg/dL Final         Passed - PLT in normal range and within 360 days    Platelets  Date Value Ref Range Status  12/12/2022 278 140 - 400 Thousand/uL Final         Passed - HCT in normal range and within 360 days    HCT  Date Value Ref Range Status  12/12/2022 41.6 38.5 - 50.0 % Final         Passed - eGFR is 30 or above and within 360 days    eGFR  Date Value Ref Range Status  12/12/2022 93 > OR = 60 mL/min/1.53m2 Final         Passed - Patient is not pregnant

## 2023-09-29 ENCOUNTER — Other Ambulatory Visit: Payer: Self-pay | Admitting: Nurse Practitioner

## 2023-09-29 DIAGNOSIS — I10 Essential (primary) hypertension: Secondary | ICD-10-CM

## 2023-09-30 NOTE — Telephone Encounter (Signed)
 Requested medications are due for refill today.  yes  Requested medications are on the active medications list.  yes  Last refill. 03/25/2023 #90 1 rf  Future visit scheduled.   yes  Notes to clinic.  Pt is more than 3 months overdue for an OV. Please advise.    Requested Prescriptions  Pending Prescriptions Disp Refills   amLODipine  (NORVASC ) 10 MG tablet [Pharmacy Med Name: amLODIPine  BESYLATE 10MG  TAB] 90 tablet 1    Sig: TAKE 1 TABLET BY MOUTH DAILY     Cardiovascular: Calcium Channel Blockers 2 Failed - 09/30/2023  4:17 PM      Failed - Valid encounter within last 6 months    Recent Outpatient Visits   None     Future Appointments             In 2 months Timothy Buckler, FNP [facility]            Passed - Last BP in normal range    BP Readings from Last 1 Encounters:  12/25/22 122/78         Passed - Last Heart Rate in normal range    Pulse Readings from Last 1 Encounters:  12/25/22 73

## 2023-12-06 ENCOUNTER — Other Ambulatory Visit: Payer: Self-pay | Admitting: Nurse Practitioner

## 2023-12-06 DIAGNOSIS — I10 Essential (primary) hypertension: Secondary | ICD-10-CM

## 2023-12-08 ENCOUNTER — Telehealth: Payer: Self-pay

## 2023-12-08 DIAGNOSIS — I10 Essential (primary) hypertension: Secondary | ICD-10-CM

## 2023-12-08 MED ORDER — LISINOPRIL 40 MG PO TABS: 40.0000 mg | ORAL_TABLET | Freq: Every day | ORAL | 0 refills | Status: DC

## 2023-12-08 NOTE — Telephone Encounter (Signed)
 Refill request on  lisinopril  (ZESTRIL ) 40 MG tablet

## 2023-12-15 ENCOUNTER — Ambulatory Visit (INDEPENDENT_AMBULATORY_CARE_PROVIDER_SITE_OTHER): Payer: Self-pay | Admitting: Nurse Practitioner

## 2023-12-15 ENCOUNTER — Encounter: Payer: Self-pay | Admitting: Nurse Practitioner

## 2023-12-15 VITALS — BP 136/76 | HR 74 | Temp 98.0°F | Resp 18 | Ht 69.0 in | Wt 193.3 lb

## 2023-12-15 DIAGNOSIS — Z13220 Encounter for screening for lipoid disorders: Secondary | ICD-10-CM

## 2023-12-15 DIAGNOSIS — Z125 Encounter for screening for malignant neoplasm of prostate: Secondary | ICD-10-CM | POA: Diagnosis not present

## 2023-12-15 DIAGNOSIS — Z0000 Encounter for general adult medical examination without abnormal findings: Secondary | ICD-10-CM | POA: Diagnosis not present

## 2023-12-15 DIAGNOSIS — E782 Mixed hyperlipidemia: Secondary | ICD-10-CM

## 2023-12-15 DIAGNOSIS — I10 Essential (primary) hypertension: Secondary | ICD-10-CM

## 2023-12-15 DIAGNOSIS — Z130 Encounter for screening for diseases of the blood and blood-forming organs and certain disorders involving the immune mechanism: Secondary | ICD-10-CM

## 2023-12-15 DIAGNOSIS — Z131 Encounter for screening for diabetes mellitus: Secondary | ICD-10-CM | POA: Diagnosis not present

## 2023-12-15 DIAGNOSIS — G43009 Migraine without aura, not intractable, without status migrainosus: Secondary | ICD-10-CM

## 2023-12-15 LAB — CBC WITH DIFFERENTIAL/PLATELET
Absolute Lymphocytes: 1837 {cells}/uL (ref 850–3900)
Absolute Monocytes: 541 {cells}/uL (ref 200–950)
Basophils Absolute: 90 {cells}/uL (ref 0–200)
Basophils Relative: 1.1 %
Eosinophils Absolute: 82 {cells}/uL (ref 15–500)
Eosinophils Relative: 1 %
HCT: 43.6 % (ref 38.5–50.0)
Hemoglobin: 13.1 g/dL — ABNORMAL LOW (ref 13.2–17.1)
MCH: 19.9 pg — ABNORMAL LOW (ref 27.0–33.0)
MCHC: 30 g/dL — ABNORMAL LOW (ref 32.0–36.0)
MCV: 66.4 fL — ABNORMAL LOW (ref 80.0–100.0)
MPV: 11.5 fL (ref 7.5–12.5)
Monocytes Relative: 6.6 %
Neutro Abs: 5650 {cells}/uL (ref 1500–7800)
Neutrophils Relative %: 68.9 %
Platelets: 256 Thousand/uL (ref 140–400)
RBC: 6.57 Million/uL — ABNORMAL HIGH (ref 4.20–5.80)
RDW: 18.1 % — ABNORMAL HIGH (ref 11.0–15.0)
Total Lymphocyte: 22.4 %
WBC: 8.2 Thousand/uL (ref 3.8–10.8)

## 2023-12-15 LAB — COMPREHENSIVE METABOLIC PANEL WITH GFR
AG Ratio: 1.7 (calc) (ref 1.0–2.5)
ALT: 31 U/L (ref 9–46)
AST: 18 U/L (ref 10–35)
Albumin: 4.8 g/dL (ref 3.6–5.1)
Alkaline phosphatase (APISO): 65 U/L (ref 35–144)
BUN: 18 mg/dL (ref 7–25)
CO2: 27 mmol/L (ref 20–32)
Calcium: 10 mg/dL (ref 8.6–10.3)
Chloride: 103 mmol/L (ref 98–110)
Creat: 0.99 mg/dL (ref 0.70–1.30)
Globulin: 2.8 g/dL (ref 1.9–3.7)
Glucose, Bld: 88 mg/dL (ref 65–99)
Potassium: 4.3 mmol/L (ref 3.5–5.3)
Sodium: 140 mmol/L (ref 135–146)
Total Bilirubin: 0.9 mg/dL (ref 0.2–1.2)
Total Protein: 7.6 g/dL (ref 6.1–8.1)
eGFR: 91 mL/min/1.73m2 (ref >=60)

## 2023-12-15 LAB — LIPID PANEL
Cholesterol: 174 mg/dL (ref <200)
HDL: 49 mg/dL (ref >=40)
LDL Cholesterol (Calc): 107 mg/dL — ABNORMAL HIGH
Non-HDL Cholesterol (Calc): 125 mg/dL (ref <130)
Total CHOL/HDL Ratio: 3.6 (calc) (ref <5.0)
Triglycerides: 90 mg/dL (ref <150)

## 2023-12-15 LAB — HEMOGLOBIN A1C
Hgb A1c MFr Bld: 5.7 % — ABNORMAL HIGH (ref <5.7)
Mean Plasma Glucose: 117 mg/dL
eAG (mmol/L): 6.5 mmol/L

## 2023-12-15 LAB — PSA: PSA: 0.51 ng/mL (ref <=4.00)

## 2023-12-15 MED ORDER — IBUPROFEN 800 MG PO TABS
800.0000 mg | ORAL_TABLET | Freq: Three times a day (TID) | ORAL | 1 refills | Status: DC | PRN
Start: 2023-12-15 — End: 2024-02-17

## 2023-12-15 NOTE — Progress Notes (Signed)
 Name: Timothy Klein   MRN: [id number]    DOB: [birth year]   Date:12/15/2023       Progress Note  Subjective  Chief Complaint  Chief Complaint  Patient presents with   Annual Exam    HPI  Patient presents for annual CPE. Discussed the use of AI scribe software for clinical note transcription with the patient, who gave verbal consent to proceed.  History of Present Illness Timothy Klein is a 53 year old male who presents for an annual physical exam.  Hypertension and hyperlipidemia - Managed with amlodipine  10 mg daily and lisinopril  40 mg daily - No chest pain - No urinary issues  Weight fluctuation and lifestyle modification - Recent weight gain after a cruise - Currently following an intermittent fasting regimen - Attempting to maintain a well-balanced diet Waist Measurement : 32 inches   Physical activity - Engages in running and hiking - Recently completed a 10K run - Hiked to a Therapist, nutritional in [employer]  Sleep disturbance - Difficulty with sleep, characterized by frequent nighttime awakenings and occasional inability to fall back asleep - Bedtime typically at 9 PM, wakes at 5 AM - Describes overall sleep quality as 'okay'  Preventive care - No recent dental exam - Eye exam performed last year    IPSS     Row Name 12/15/23 1407         International Prostate Symptom Score   How often have you had the sensation of not emptying your bladder? Not at All     How often have you had to urinate less than every two hours? Less than 1 in 5 times     How often have you found you stopped and started again several times when you urinated? Not at All     How often have you found it difficult to postpone urination? Not at All     How often have you had a weak urinary stream? Not at All     How often have you had to strain to start urination? Not at All     How many times did you typically get up at night to urinate? 1 Time     Total IPSS Score 2       Quality of Life  due to urinary symptoms   If you were to spend the rest of your life with your urinary condition just the way it is now how would you feel about that? Delighted         Diet: well balanced diet, just got off a cruise Exercise: runner Sleep: not enough Last dental exam:due Last eye exam: last year  Depression: phq 9 is negative    12/15/2023    2:02 PM 12/25/2022    2:24 PM 12/12/2022    2:39 PM 06/13/2022    3:06 PM 12/14/2021    1:06 PM  Depression screen PHQ 2/9  Decreased Interest 0 0 0 0 0  Down, Depressed, Hopeless 0  0 0 0  PHQ - 2 Score 0 0 0 0 0  Altered sleeping 0      Tired, decreased energy 0      Change in appetite 0      Feeling bad or failure about yourself  0      Trouble concentrating 0      Moving slowly or fidgety/restless 0      Suicidal thoughts 0      PHQ-9 Score 0      Difficult doing  work/chores Not difficult at all        Hypertension:  BP Readings from Last 3 Encounters:  12/15/23 136/76  12/25/22 122/78  12/12/22 130/82    Obesity: Wt Readings from Last 3 Encounters:  12/15/23 193 lb 4.8 oz (87.7 kg)  12/25/22 181 lb 8 oz (82.3 kg)  12/12/22 183 lb 12.8 oz (83.4 kg)   BMI Readings from Last 3 Encounters:  12/15/23 28.55 kg/m  12/25/22 27.60 kg/m  12/12/22 27.95 kg/m     Lipids:  Lab Results  Component Value Date   CHOL 161 12/12/2022   CHOL 167 12/14/2021   Lab Results  Component Value Date   HDL 59 12/12/2022   HDL 39 (L) 12/14/2021   Lab Results  Component Value Date   LDLCALC 88 12/12/2022   LDLCALC 105 (H) 12/14/2021   Lab Results  Component Value Date   TRIG 49 12/12/2022   TRIG 129 12/14/2021   Lab Results  Component Value Date   CHOLHDL 2.7 12/12/2022   CHOLHDL 4.3 12/14/2021   No results found for: LDLDIRECT Glucose:  Glucose, Bld  Date Value Ref Range Status  12/12/2022 87 65 - 99 mg/dL Final    Comment:    .            Fasting reference interval .   12/14/2021 86 65 - 99 mg/dL Final     Comment:    .            Fasting reference interval .     Flowsheet Row Office Visit from 12/15/2023 in [facility]  AUDIT-C Score 3      Married STD testing and prevention (HIV/chl/gon/syphilis): completed Hep C: completed  Skin cancer: Discussed monitoring for atypical lesions Colorectal cancer: 11/09/2020 Prostate cancer: ordered Lab Results  Component Value Date   PSA 0.54 12/12/2022     Lung cancer:   Low Dose CT Chest recommended if Age 44-80 years, 30 pack-year currently smoking OR have quit w/in 15years. Patient does not qualify.   AAA:  The USPSTF recommends one-time screening with ultrasonography in men ages 60 to 31 years who have ever smoked ECG:  12/25/2022  Vaccines:  HPV: up to at age 25 , ask insurance if age between 8-45  Shingrix: 56-64 yo and ask insurance if covered when patient above 67 yo Pneumonia:  educated and discussed with patient. Flu:  educated and discussed with patient.  Advanced Care Planning: A voluntary discussion about advance care planning including the explanation and discussion of advance directives.  Discussed health care proxy and Living will, and the patient was able to identify a health care proxy as [place].  Patient does not have a living will at present time. If patient does have living will, I have requested they bring this to the clinic to be scanned in to their chart.  Patient Active Problem List   Diagnosis Date Noted   Mixed hyperlipidemia 06/13/2022   Essential hypertension 12/14/2021   Migraine without aura and without status migrainosus, not intractable 12/14/2021    Past Surgical History:  Procedure Laterality Date   ELBOW ARTHROPLASTY Left     Family History  Problem Relation Age of Onset   Hypertension Mother    Hypertension Father     Social History   Socioeconomic History   Marital status: Married    Spouse name: Timothy Klein   Number of children: 1   Years of education: Not on  file   Highest education  level: Some college, no degree  Occupational History   Not on file  Tobacco Use   Smoking status: Former    Current packs/day: 0.00    Average packs/day: 1 pack/day for 15.0 years (15.0 ttl pk-yrs)    Types: Cigarettes    Quit date: 11/27/2011    Years since quitting: 12.0   Smokeless tobacco: Never  Vaping Use   Vaping status: Never Used  Substance and Sexual Activity   Alcohol use: Yes   Drug use: Never   Sexual activity: Yes    Partners: Female  Other Topics Concern   Not on file  Social History Narrative   Not on file   Social Drivers of Health   Financial Resource Strain: Low Risk  (12/11/2023)   Overall Financial Resource Strain (CARDIA)    Difficulty of Paying Living Expenses: Not hard at all  Food Insecurity: No Food Insecurity (12/11/2023)   Hunger Vital Sign    Worried About Running Out of Food in the Last Year: Never true    Ran Out of Food in the Last Year: Never true  Transportation Needs: No Transportation Needs (12/11/2023)   PRAPARE - Administrator, Civil Service (Medical): No    Lack of Transportation (Non-Medical): No  Physical Activity: Sufficiently Active (12/11/2023)   Exercise Vital Sign    Days of Exercise per Week: 7 days    Minutes of Exercise per Session: 60 min  Stress: No Stress Concern Present (12/11/2023)   [employer] of Occupational Health - Occupational Stress Questionnaire    Feeling of Stress: Not at all  Social Connections: Moderately Isolated (12/11/2023)   Social Connection and Isolation Panel    Frequency of Communication with Friends and Family: More than three times a week    Frequency of Social Gatherings with Friends and Family: Once a week    Attends Religious Services: Patient declined    Database administrator or Organizations: No    Attends Engineer, structural: Not on file    Marital Status: Married  Catering manager Violence: Not At Risk (12/15/2023)   Humiliation, Afraid,  Rape, and Kick questionnaire    Fear of Current or Ex-Partner: No    Emotionally Abused: No    Physically Abused: No    Sexually Abused: No     Current Outpatient Medications:    amLODipine  (NORVASC ) 10 MG tablet, TAKE 1 TABLET BY MOUTH DAILY, Disp: 90 tablet, Rfl: 0   lisinopril  (ZESTRIL ) 40 MG tablet, Take 1 tablet (40 mg total) by mouth daily., Disp: 30 tablet, Rfl: 0   ibuprofen  (ADVIL ) 800 MG tablet, Take 1 tablet (800 mg total) by mouth every 8 (eight) hours as needed., Disp: 90 tablet, Rfl: 1  No Known Allergies   ROS  Constitutional: Negative for fever or weight change.  Respiratory: Negative for cough and shortness of breath.   Cardiovascular: Negative for chest pain or palpitations.  Gastrointestinal: Negative for abdominal pain, no bowel changes.  Musculoskeletal: Negative for gait problem or joint swelling.  Skin: Negative for rash.  Neurological: Negative for dizziness or headache.  No other specific complaints in a complete review of systems (except as listed in HPI above).    Objective  Vitals:   12/15/23 1407 12/15/23 1450  BP: (!) 142/80 136/76  Pulse: 74   Resp: 18   Temp: 98 F (36.7 C)   SpO2: 98%   Weight: 193 lb 4.8 oz (87.7 kg)   Height: 5' 9 (1.753  m)     Body mass index is 28.55 kg/m.  Physical Exam Vitals reviewed.  Constitutional:      Appearance: Normal appearance.  HENT:     Head: Normocephalic.     Right Ear: Tympanic membrane normal.     Left Ear: Tympanic membrane normal.     Nose: Nose normal.  Eyes:     Extraocular Movements: Extraocular movements intact.     Conjunctiva/sclera: Conjunctivae normal.     Pupils: Pupils are equal, round, and reactive to light.  Neck:     Thyroid: No thyroid mass, thyromegaly or thyroid tenderness.  Cardiovascular:     Rate and Rhythm: Normal rate and regular rhythm.     Pulses: Normal pulses.     Heart sounds: Normal heart sounds.  Pulmonary:     Effort: Pulmonary effort is normal.      Breath sounds: Normal breath sounds.  Abdominal:     General: Bowel sounds are normal.     Palpations: Abdomen is soft.  Musculoskeletal:        General: Normal range of motion.     Cervical back: Normal range of motion and neck supple.     Right lower leg: No edema.     Left lower leg: No edema.  Skin:    General: Skin is warm and dry.     Capillary Refill: Capillary refill takes less than 2 seconds.  Neurological:     General: No focal deficit present.     Mental Status: He is alert and oriented to person, place, and time. Mental status is at baseline.  Psychiatric:        Mood and Affect: Mood normal.        Behavior: Behavior normal.        Thought Content: Thought content normal.        Judgment: Judgment normal.      No results found for this or any previous visit (from the past 2160 hours).   Fall Risk:    12/15/2023    2:02 PM 12/25/2022    2:23 PM 12/12/2022    2:38 PM 06/13/2022    3:06 PM 12/14/2021    1:05 PM  Fall Risk   Falls in the past year? 0 0 0 0 0  Number falls in past yr: 0 0 0 0 0  Injury with Fall? 0 0 0 0 0  Follow up Falls evaluation completed   Falls evaluation completed  Falls evaluation completed      Data saved with a previous flowsheet row definition      Functional Status Survey: Is the patient deaf or have difficulty hearing?: No Does the patient have difficulty seeing, even when wearing glasses/contacts?: No Does the patient have difficulty concentrating, remembering, or making decisions?: No Does the patient have difficulty walking or climbing stairs?: No Does the patient have difficulty dressing or bathing?: No Does the patient have difficulty doing errands alone such as visiting a doctor's office or shopping?: No    Assessment & Plan  Problem List Items Addressed This Visit       Cardiovascular and Mediastinum   Essential hypertension   Migraine without aura and without status migrainosus, not intractable   Relevant  Medications   ibuprofen  (ADVIL ) 800 MG tablet     Other   Mixed hyperlipidemia   Other Visit Diagnoses       Annual physical exam    -  Primary   Relevant Orders   CBC  with Differential/Platelet   Comprehensive metabolic panel with GFR   Lipid panel   Hemoglobin A1c   PSA     Screening for prostate cancer       Relevant Orders   PSA     Screening for diabetes mellitus       Relevant Orders   Comprehensive metabolic panel with GFR   Hemoglobin A1c     Screening for cholesterol level       Relevant Orders   Lipid panel     Screening for deficiency anemia       Relevant Orders   CBC with Differential/Platelet       Assessment and Plan Assessment & Plan Adult Wellness Visit Routine annual physical examination with slight weight gain post-cruise and current engagement in intermittent fasting. Blood pressure slightly elevated at 142/80 mmHg. - Perform routine lab tests - Recheck blood pressure before departure  Hypertension Hypertension managed with amlodipine  10 mg daily and lisinopril  40 mg daily. Blood pressure slightly elevated at 142/80 mmHg. - Recheck blood pressure before departure  Hyperlipidemia Hyperlipidemia. - Perform routine lab tests to assess lipid levels  General Health Maintenance Colonoscopy is up to date. Last eye exam was last year. No recent dental exam reported.     -Prostate cancer screening and PSA options (with potential risks and benefits of testing vs not testing) were discussed along with recent recs/guidelines. -USPSTF grade A and B recommendations reviewed with patient; age-appropriate recommendations, preventive care, screening tests, etc discussed and encouraged; healthy living encouraged; see AVS for patient education given to patient -Discussed importance of 150 minutes of physical activity weekly, eat two servings of fish weekly, eat one serving of tree nuts ( cashews, pistachios, pecans, almonds.SABRA) every other day, eat 6 servings of  fruit/vegetables daily and drink plenty of water and avoid sweet beverages.  -Reviewed Health Maintenance: yes

## 2023-12-16 ENCOUNTER — Ambulatory Visit: Payer: Self-pay | Admitting: Nurse Practitioner

## 2023-12-27 ENCOUNTER — Other Ambulatory Visit: Payer: Self-pay | Admitting: Nurse Practitioner

## 2023-12-27 DIAGNOSIS — I10 Essential (primary) hypertension: Secondary | ICD-10-CM

## 2023-12-29 NOTE — Telephone Encounter (Signed)
 Requested Prescriptions  Pending Prescriptions Disp Refills   amLODipine  (NORVASC ) 10 MG tablet [Pharmacy Med Name: amLODIPine  BESYLATE 10MG  TAB] 90 tablet 1    Sig: TAKE 1 TABLET BY MOUTH DAILY     Cardiovascular: Calcium Channel Blockers 2 Passed - 12/29/2023  2:33 PM      Passed - Last BP in normal range    BP Readings from Last 1 Encounters:  12/15/23 136/76         Passed - Last Heart Rate in normal range    Pulse Readings from Last 1 Encounters:  12/15/23 74         Passed - Valid encounter within last 6 months    Recent Outpatient Visits           2 weeks ago Annual physical exam   [facility] Gareth Mliss FALCON, FNP       Future Appointments             In 11 months Gareth, Mliss FALCON, FNP [facility], [facility]

## 2024-01-09 ENCOUNTER — Other Ambulatory Visit: Payer: Self-pay | Admitting: Nurse Practitioner

## 2024-01-09 DIAGNOSIS — I10 Essential (primary) hypertension: Secondary | ICD-10-CM

## 2024-01-12 NOTE — Telephone Encounter (Signed)
 Requested Prescriptions  Pending Prescriptions Disp Refills   lisinopril  (ZESTRIL ) 40 MG tablet [Pharmacy Med Name: LISINOPRIL  40 MG TABLET] 90 tablet 3    Sig: TAKE 1 TABLET BY MOUTH DAILY     Cardiovascular:  ACE Inhibitors Passed - 01/12/2024  4:21 PM      Passed - Cr in normal range and within 180 days    Creat  Date Value Ref Range Status  12/15/2023 0.99 0.70 - 1.30 mg/dL Final         Passed - K in normal range and within 180 days    Potassium  Date Value Ref Range Status  12/15/2023 4.3 3.5 - 5.3 mmol/L Final         Passed - Patient is not pregnant      Passed - Last BP in normal range    BP Readings from Last 1 Encounters:  12/15/23 136/76         Passed - Valid encounter within last 6 months    Recent Outpatient Visits           4 weeks ago Annual physical exam   [facility] Gareth Mliss FALCON, FNP       Future Appointments             In 11 months Gareth, Mliss FALCON, FNP [facility], [facility]

## 2024-02-15 ENCOUNTER — Other Ambulatory Visit: Payer: Self-pay | Admitting: Nurse Practitioner

## 2024-02-15 DIAGNOSIS — G43009 Migraine without aura, not intractable, without status migrainosus: Secondary | ICD-10-CM

## 2024-02-17 NOTE — Telephone Encounter (Signed)
 Requested medication (s) are due for refill today: yes   Requested medication (s) are on the active medication list: yes   Last refill:  12/15/23 #90 1 refills  Future visit scheduled: yes in 10 months   Notes to clinic:  manuel review of labs.  Do you want to refill Rx?     Requested Prescriptions  Pending Prescriptions Disp Refills   ibuprofen  (ADVIL ) 800 MG tablet [Pharmacy Med Name: IBUPROFEN  800 MG TABLET] 90 tablet 1    Sig: TAKE 1 TABLET BY MOUTH EVERY 8 HOURS AS NEEDED     Analgesics:  NSAIDS Failed - 02/17/2024  9:20 AM      Failed - Manual Review: Labs are only required if the patient has taken medication for more than 8 weeks.      Failed - HGB in normal range and within 360 days    Hemoglobin  Date Value Ref Range Status  12/15/2023 13.1 (L) 13.2 - 17.1 g/dL Final         Passed - Cr in normal range and within 360 days    Creat  Date Value Ref Range Status  12/15/2023 0.99 0.70 - 1.30 mg/dL Final         Passed - PLT in normal range and within 360 days    Platelets  Date Value Ref Range Status  12/15/2023 256 140 - 400 Thousand/uL Final         Passed - HCT in normal range and within 360 days    HCT  Date Value Ref Range Status  12/15/2023 43.6 38.5 - 50.0 % Final         Passed - eGFR is 30 or above and within 360 days    eGFR  Date Value Ref Range Status  12/15/2023 91 > OR = 60 mL/min/1.73m2 Final         Passed - Patient is not pregnant      Passed - Valid encounter within last 12 months    Recent Outpatient Visits           2 months ago Annual physical exam   [facility] Gareth Mliss FALCON, FNP       Future Appointments             In 10 months Gareth, Mliss FALCON, FNP [facility] [place]

## 2024-04-16 ENCOUNTER — Other Ambulatory Visit: Payer: Self-pay | Admitting: Nurse Practitioner

## 2024-04-16 DIAGNOSIS — I10 Essential (primary) hypertension: Secondary | ICD-10-CM

## 2024-04-19 NOTE — Telephone Encounter (Signed)
 Second request

## 2024-12-16 ENCOUNTER — Encounter: Admitting: Nurse Practitioner
